# Patient Record
Sex: Female | Born: 1965
Health system: Southern US, Community
[De-identification: ages and names within clinical notes are randomized; demographics above are authoritative.]

## PROBLEM LIST (undated history)

## (undated) DIAGNOSIS — N950 Postmenopausal bleeding: Secondary | ICD-10-CM

## (undated) DIAGNOSIS — E039 Hypothyroidism, unspecified: Secondary | ICD-10-CM

## (undated) DIAGNOSIS — Z9889 Other specified postprocedural states: Secondary | ICD-10-CM

## (undated) DIAGNOSIS — Z8489 Family history of other specified conditions: Secondary | ICD-10-CM

---

## 1998-03-24 ENCOUNTER — Ambulatory Visit (HOSPITAL_COMMUNITY): Admission: RE | Admit: 1998-03-24 | Discharge: 1998-03-24 | Payer: Self-pay | Admitting: Obstetrics and Gynecology

## 1998-06-11 ENCOUNTER — Inpatient Hospital Stay (HOSPITAL_COMMUNITY): Admission: AD | Admit: 1998-06-11 | Discharge: 1998-06-13 | Payer: Self-pay | Admitting: Obstetrics and Gynecology

## 1998-07-16 ENCOUNTER — Encounter (HOSPITAL_COMMUNITY): Admission: RE | Admit: 1998-07-16 | Discharge: 1998-10-14 | Payer: Self-pay | Admitting: Obstetrics and Gynecology

## 1998-07-28 ENCOUNTER — Other Ambulatory Visit: Admission: RE | Admit: 1998-07-28 | Discharge: 1998-07-28 | Payer: Self-pay | Admitting: Obstetrics and Gynecology

## 2000-07-23 ENCOUNTER — Other Ambulatory Visit: Admission: RE | Admit: 2000-07-23 | Discharge: 2000-07-23 | Payer: Self-pay | Admitting: Obstetrics and Gynecology

## 2000-12-02 ENCOUNTER — Ambulatory Visit (HOSPITAL_COMMUNITY): Admission: RE | Admit: 2000-12-02 | Discharge: 2000-12-02 | Payer: Self-pay | Admitting: Obstetrics and Gynecology

## 2001-05-23 ENCOUNTER — Ambulatory Visit (HOSPITAL_COMMUNITY): Admission: RE | Admit: 2001-05-23 | Discharge: 2001-05-23 | Payer: Self-pay | Admitting: Obstetrics and Gynecology

## 2001-08-04 ENCOUNTER — Other Ambulatory Visit: Admission: RE | Admit: 2001-08-04 | Discharge: 2001-08-04 | Payer: Self-pay | Admitting: Obstetrics and Gynecology

## 2001-08-15 ENCOUNTER — Encounter: Payer: Self-pay | Admitting: Obstetrics and Gynecology

## 2001-08-15 ENCOUNTER — Ambulatory Visit (HOSPITAL_COMMUNITY): Admission: RE | Admit: 2001-08-15 | Discharge: 2001-08-15 | Payer: Self-pay | Admitting: Obstetrics and Gynecology

## 2002-03-09 ENCOUNTER — Ambulatory Visit (HOSPITAL_COMMUNITY): Admission: RE | Admit: 2002-03-09 | Discharge: 2002-03-09 | Payer: Self-pay | Admitting: Obstetrics and Gynecology

## 2002-05-28 ENCOUNTER — Inpatient Hospital Stay (HOSPITAL_COMMUNITY): Admission: AD | Admit: 2002-05-28 | Discharge: 2002-05-30 | Payer: Self-pay | Admitting: Obstetrics and Gynecology

## 2002-07-09 ENCOUNTER — Other Ambulatory Visit: Admission: RE | Admit: 2002-07-09 | Discharge: 2002-07-09 | Payer: Self-pay

## 2003-01-23 HISTORY — PX: LASIK: SHX215

## 2003-07-28 ENCOUNTER — Other Ambulatory Visit: Admission: RE | Admit: 2003-07-28 | Discharge: 2003-07-28 | Payer: Self-pay | Admitting: Obstetrics and Gynecology

## 2004-03-31 ENCOUNTER — Encounter: Admission: RE | Admit: 2004-03-31 | Discharge: 2004-03-31 | Payer: Self-pay | Admitting: Obstetrics and Gynecology

## 2004-09-12 ENCOUNTER — Other Ambulatory Visit: Admission: RE | Admit: 2004-09-12 | Discharge: 2004-09-12 | Payer: Self-pay | Admitting: Obstetrics and Gynecology

## 2006-08-15 ENCOUNTER — Encounter: Admission: RE | Admit: 2006-08-15 | Discharge: 2006-08-15 | Payer: Self-pay | Admitting: Obstetrics and Gynecology

## 2008-05-21 ENCOUNTER — Ambulatory Visit (HOSPITAL_COMMUNITY): Admission: RE | Admit: 2008-05-21 | Discharge: 2008-05-21 | Payer: Self-pay | Admitting: Obstetrics and Gynecology

## 2009-08-23 ENCOUNTER — Encounter: Admission: RE | Admit: 2009-08-23 | Discharge: 2009-08-23 | Payer: Self-pay | Admitting: Obstetrics and Gynecology

## 2010-08-09 ENCOUNTER — Other Ambulatory Visit: Payer: Self-pay | Admitting: Chiropractic Medicine

## 2010-08-09 DIAGNOSIS — M25511 Pain in right shoulder: Secondary | ICD-10-CM

## 2010-08-10 ENCOUNTER — Other Ambulatory Visit: Payer: Self-pay

## 2010-08-11 ENCOUNTER — Ambulatory Visit
Admission: RE | Admit: 2010-08-11 | Discharge: 2010-08-11 | Disposition: A | Discharge status: Home / Self Care | Payer: 59 | Source: Ambulatory Visit | Attending: Chiropractic Medicine | Admitting: Chiropractic Medicine

## 2010-08-11 DIAGNOSIS — M25511 Pain in right shoulder: Secondary | ICD-10-CM

## 2010-08-23 HISTORY — PX: SHOULDER ARTHROSCOPY WITH ROTATOR CUFF REPAIR AND OPEN BICEPS TENODESIS: SHX6677

## 2011-04-17 ENCOUNTER — Other Ambulatory Visit: Payer: Self-pay | Admitting: Chiropractic Medicine

## 2011-04-17 DIAGNOSIS — M25511 Pain in right shoulder: Secondary | ICD-10-CM

## 2011-04-19 ENCOUNTER — Ambulatory Visit
Admission: RE | Admit: 2011-04-19 | Discharge: 2011-04-19 | Disposition: A | Discharge status: Home / Self Care | Payer: 59 | Source: Ambulatory Visit | Attending: Chiropractic Medicine | Admitting: Chiropractic Medicine

## 2011-04-19 DIAGNOSIS — M25511 Pain in right shoulder: Secondary | ICD-10-CM

## 2011-04-23 HISTORY — PX: SHOULDER ARTHROSCOPY WITH ROTATOR CUFF REPAIR: SHX5685

## 2012-04-23 ENCOUNTER — Other Ambulatory Visit: Payer: Self-pay | Admitting: Obstetrics and Gynecology

## 2012-04-23 DIAGNOSIS — R928 Other abnormal and inconclusive findings on diagnostic imaging of breast: Secondary | ICD-10-CM

## 2012-05-02 ENCOUNTER — Ambulatory Visit
Admission: RE | Admit: 2012-05-02 | Discharge: 2012-05-02 | Disposition: A | Discharge status: Home / Self Care | Payer: 59 | Source: Ambulatory Visit | Attending: Obstetrics and Gynecology | Admitting: Obstetrics and Gynecology

## 2012-05-02 DIAGNOSIS — R928 Other abnormal and inconclusive findings on diagnostic imaging of breast: Secondary | ICD-10-CM

## 2013-01-08 ENCOUNTER — Other Ambulatory Visit: Payer: Self-pay | Admitting: Chiropractic Medicine

## 2013-01-08 DIAGNOSIS — M25512 Pain in left shoulder: Secondary | ICD-10-CM

## 2013-01-13 ENCOUNTER — Ambulatory Visit
Admission: RE | Admit: 2013-01-13 | Discharge: 2013-01-13 | Disposition: A | Discharge status: Home / Self Care | Payer: 59 | Source: Ambulatory Visit | Attending: Chiropractic Medicine | Admitting: Chiropractic Medicine

## 2013-01-13 DIAGNOSIS — M25512 Pain in left shoulder: Secondary | ICD-10-CM

## 2013-02-18 ENCOUNTER — Encounter: Payer: Self-pay | Admitting: Sports Medicine

## 2013-02-18 ENCOUNTER — Ambulatory Visit (INDEPENDENT_AMBULATORY_CARE_PROVIDER_SITE_OTHER): Payer: 59 | Admitting: Sports Medicine

## 2013-02-18 VITALS — BP 126/85 | Ht 63.5 in | Wt 127.0 lb

## 2013-02-18 DIAGNOSIS — M719 Bursopathy, unspecified: Secondary | ICD-10-CM

## 2013-02-18 DIAGNOSIS — IMO0002 Reserved for concepts with insufficient information to code with codable children: Secondary | ICD-10-CM

## 2013-02-18 DIAGNOSIS — M6790 Unspecified disorder of synovium and tendon, unspecified site: Secondary | ICD-10-CM

## 2013-02-18 DIAGNOSIS — M7550 Bursitis of unspecified shoulder: Secondary | ICD-10-CM

## 2013-02-18 DIAGNOSIS — M75100 Unspecified rotator cuff tear or rupture of unspecified shoulder, not specified as traumatic: Secondary | ICD-10-CM

## 2013-02-18 DIAGNOSIS — M67919 Unspecified disorder of synovium and tendon, unspecified shoulder: Secondary | ICD-10-CM

## 2013-02-18 NOTE — Progress Notes (Signed)
   Subjective:    Patient ID: Leslie Richard, female    DOB: 1965-04-10, 48 y.o.   MRN: [id number]  HPI Leslie Richard is a 48 year-old lady who presents today complaining of bilateral shoulder pain.  She has had a long history of pain.  In August 2012, she had RTC (supraspinatus) surgery followed by another RTC surgery in April 2013 after she had fallen down stairs injuring her right biceps and suprispinatus. Pain much improved after the surgery however this past October (2014) noted increased pain of her shoulders, especially worse in December noting that she had 6-7 dull constant aching of her shoulders and limited range of motion.  She is an avid Firefighter and has been following with Angelia Mould, Physical Therapist and also a chiropractor who has been doing active release therapy for bursitis in her left shoulder.  She has tendinopathy in her right shoulder.  See A&P for Dec MRI details.  She states she has been working larger muscle groups in her back and shoulders with her personal trainer and that the pain in her shoulders is getting better; however she is concerned about pain that is exacerbated by tennis which results in varying degrees of pain at posterolateral and medial shoulder as well as scapular areas and wants to ensure that she is on a successful recovery track before tennis season really picks up in six weeks.    Review of Systems As above.    Objective:   Physical Exam General: NAD; well-developed, well-nourished HEENT: [place]/AT; PER Cardio: RRR; no m/r/g Respiratory: CTAB; work of breathing unlabored Abd: soft, nondistended MSK: Shoulders - Obrien's negative bilaterally, Hawkin's negative bilaterally.  Patient has good abducting, adducting, flexion and extension strength.  Of note, left internal rotation > right.  Also, there is a some crepitus noted beneath left scapula on abduction of left arm. Spurling's negative. Neer's and empty can tests normal. Good range of motion all  planes. No scapular winging. Neuro: AAOx3; CN grossly intact Psych: normal mood and affect      Assessment & Plan:  Bilateral shoulder pain: -MRI of left shoulder December 2014 showed presence of bursitis, otherwise normal. -MRI of right shoulder December 2014 showed findings c/w previous rotator cuff repair, RTC intact with presence of mild tendinopathy in the supraspinatus and infraspinatus.  Also biceps tendinosis.  Mild edema noted in superior aspect of humeral head which may be 2/2 prior surgery or mild RTC tendinopathy. -Patient encouraged to continue scapular stabilization exercises and strength training.  She was cautioned about lifting weights overhead.   -Patient seems to be doing well with her current regimen.  She declines stronger NSAID at this time to use on a prn basis.  May consider if she experiences increased pain during her more active tennis periods. - Follow up PRN  Above written by Matthew Folks, MD visiting PGY III.

## 2013-03-10 ENCOUNTER — Encounter: Payer: Self-pay | Admitting: Sports Medicine

## 2013-03-11 ENCOUNTER — Encounter: Payer: Self-pay | Admitting: *Deleted

## 2013-03-11 MED ORDER — DICLOFENAC SODIUM 75 MG PO TBEC: 75.0000 mg | DELAYED_RELEASE_TABLET | Freq: Two times a day (BID) | ORAL | Status: DC | PRN

## 2013-04-06 ENCOUNTER — Ambulatory Visit: Payer: 59 | Admitting: Sports Medicine

## 2013-04-08 ENCOUNTER — Ambulatory Visit (INDEPENDENT_AMBULATORY_CARE_PROVIDER_SITE_OTHER): Payer: 59 | Admitting: Sports Medicine

## 2013-04-08 ENCOUNTER — Encounter: Payer: Self-pay | Admitting: Sports Medicine

## 2013-04-08 VITALS — BP 117/67 | HR 71 | Ht 63.5 in | Wt 127.0 lb

## 2013-04-08 DIAGNOSIS — M25519 Pain in unspecified shoulder: Secondary | ICD-10-CM

## 2013-04-08 NOTE — Progress Notes (Addendum)
   Subjective:    Patient ID: Leslie Richard, female    DOB: [birth year], 48 y.o.   MRN: [id number]  HPI  Leslie Richard is right hand dominant and has a history of left shoulder bursitis proven on MRI who presents with new onset of left shoulder pain. Approximately 3 weeks ago when she tripped while walking up the stairs and grabbed the hand rail trying to catch herself and has her body went forward her arm was stretched in abduction and external rotation. She had immediate pain which was improving with ice, heat, rest, Voltaren, chiropractor, and acupuncture treatment. Then on 3/16 while playing tennis she twisted her body to go for a ball and had a shock of pain in the same place of the shoulder. She was holding her racket in her right hand at the time. Pain was relieved with massage and ice.   Pain is dull, achy, and constant. Denies nighttime waking due to pain or radiating down the arm. She has been limiting the amount of upper body activity and cannot think of anything that makes the pain worse. Improved with Voltaren 75 MG BID.   She has a well-documented history of problems with the shoulder. Please see her previous office note for further details.   Review of Systems Negative apart for HPI     Objective:   Physical Exam  Shoulder Inspection: no erythema, swelling Palpation: Mildly tender to palpation along the teres minor with small trigger point. No radiating pain. No tenderness along the shoulder joint or rotator cuff insertion points. ROM: normal ROM bilaterally. Normal strength in rotator cuff and biceps and triceps Special tests: negative empty can, negative Hawkins, negative O'Briens.     Assessment & Plan:  Left shoulder pain likely secondary to teres minor muscle strain  --Reassurance. Today's exam does not suggest any sort of rotator cuff or labral injury. --Keep doing shoulder strengthening exercises --Continue to go to acupuncture and massage if this is helpful --Can  try lying on tennis ball at home to massage the muscle --Follow-up in 3 weeks if symptoms are not improved.   Seen with Jacqulyn Liner, MS4

## 2013-05-04 ENCOUNTER — Ambulatory Visit (INDEPENDENT_AMBULATORY_CARE_PROVIDER_SITE_OTHER): Payer: 59 | Admitting: Sports Medicine

## 2013-05-04 ENCOUNTER — Encounter: Payer: Self-pay | Admitting: Sports Medicine

## 2013-05-04 VITALS — BP 108/71 | Wt 126.0 lb

## 2013-05-04 DIAGNOSIS — M75100 Unspecified rotator cuff tear or rupture of unspecified shoulder, not specified as traumatic: Secondary | ICD-10-CM

## 2013-05-04 DIAGNOSIS — M7550 Bursitis of unspecified shoulder: Secondary | ICD-10-CM

## 2013-05-04 DIAGNOSIS — IMO0002 Reserved for concepts with insufficient information to code with codable children: Secondary | ICD-10-CM

## 2013-05-04 MED ORDER — METHYLPREDNISOLONE ACETATE 40 MG/ML IJ SUSP
40.0000 mg | Freq: Once | INTRAMUSCULAR | Status: AC
  Administered 2013-05-04: 40 mg via INTRA_ARTICULAR

## 2013-05-04 NOTE — Progress Notes (Signed)
   Subjective:    Patient ID: Leslie Richard, female    DOB: Dec 11, 1965, 48 y.o.   MRN: [id number]  HPI Patient comes in today for followup on left shoulder pain. Unfortunately, her shoulder is still bothering her. She does note good symptom relief with diclofenac but as soon as she stops taking this medicine her symptoms return. She is having pain along the posterior and lateral aspect of her shoulder. A recent MRI scan prior to her injury showed evidence of subacromial bursitis. No rotator cuff tear. No labral tear. Patient's symptoms are identical in nature to what she was experiencing with her bursitis. No numbness or tingling. She is having some difficulty sleeping on the shoulder at night.    Review of Systems     Objective:   Physical Exam Well-developed, well-nourished. No acute distress. Awake alert and oriented x3. Vital signs are reviewed.  Left shoulder: Full range of motion with a positive painful ARC. No tenderness over the a.c. joint or bicipital groove. Negative empty can but positive Hawkins. Rotator cuff strength is 5/5. Negative O'Briens. There is pain with passive abduction and external rotation which is localized to the lateral shoulder. Neurovascularly intact distally.       Assessment & Plan:  Persistent left shoulder pain with MRI evidence of subacromial bursitis  With her persistent symptoms I do not believe that she is dealing with a simple teres minor muscle strain. Since her symptoms are similar to what she experienced with her previous subacromial bursitis I recommend a diagnostic/therapeutic subacromial cortisone injection today. I will refill her diclofenac to take on an as-needed basis since it does seem to help with her pain. Once her pain has improved, she will resume a rotator cuff strengthening program (she is aware of these exercises). Followup with me in 4 weeks. If symptoms persist we may need to consider merits of repeat imaging to rule out either a  new rotator cuff tear or possibly a labral tear.  Consent obtained and verified. Time-out conducted. Noted no overlying erythema, induration, or other signs of local infection. Skin prepped in a sterile fashion. Topical analgesic spray: Ethyl chloride. Joint: left subacromial space Needle: 25g 1.5 inch Completed without difficulty. Meds: 1cc (40mg ) depomedrol, 3cc 1% xylocaine  Advised to call if fevers/chills, erythema, induration, drainage, or persistent bleeding.

## 2013-05-06 ENCOUNTER — Other Ambulatory Visit: Payer: Self-pay | Admitting: Sports Medicine

## 2013-05-27 ENCOUNTER — Ambulatory Visit (INDEPENDENT_AMBULATORY_CARE_PROVIDER_SITE_OTHER): Payer: 59 | Admitting: Sports Medicine

## 2013-05-27 ENCOUNTER — Encounter: Payer: Self-pay | Admitting: Sports Medicine

## 2013-05-27 VITALS — BP 120/71 | Ht 63.5 in | Wt 126.0 lb

## 2013-05-27 DIAGNOSIS — IMO0002 Reserved for concepts with insufficient information to code with codable children: Secondary | ICD-10-CM

## 2013-05-27 DIAGNOSIS — M75100 Unspecified rotator cuff tear or rupture of unspecified shoulder, not specified as traumatic: Secondary | ICD-10-CM

## 2013-05-27 DIAGNOSIS — M7550 Bursitis of unspecified shoulder: Secondary | ICD-10-CM

## 2013-05-27 MED ORDER — DICLOFENAC SODIUM 75 MG PO TBEC: 75.0000 mg | DELAYED_RELEASE_TABLET | Freq: Two times a day (BID) | ORAL | Status: DC

## 2013-05-27 NOTE — Progress Notes (Signed)
   Subjective:    Patient ID: Leslie Richard, female    DOB: 02/16/1965, 48 y.o.   MRN: [id number]  HPI Patient comes in today for followup on left shoulder pain. Overall, pain has improved over the past 4 weeks, especially over the past week. For the past 7 nights she has been able to sleep without pain. She is not sure whether or not the subacromial cortisone injection was of much help. She continues to take diclofenac but has weaned down to taking it only once daily and finds it quite helpful. She is able to do most activities of daily living as well as recreational activities without pain. She only gets occasional catching and pain in the shoulder which at times will radiate down the entire arm. She has been doing her Jobe home exercises and has also been doing some scapular protraction exercises but she feels like that protraction exercises actually caused her increasing discomfort.    Review of Systems     Objective:   Physical Exam Well-developed, fit-appearing. No acute distress. Awake alert and oriented x3.  Left shoulder: Patient demonstrates full painless shoulder range of motion. There is no tenderness over the a.c. joint nor over the bicipital groove. Her rotator cuff strength is 5/5 and nonreproducible of pain. Negative empty can, negative Hawkins. Negative O'Brien, negative clunk. Negative speed, negative Yergason. Neurovascularly intact distally.       Assessment & Plan:  Improving left shoulder pain with previous MRI evidence of subacromial bursitis  Given her overall improvement we will have her continue with her home exercise program. However, I want her to replace her scapular protraction exercises with scapular retraction exercises. I would also like for her to wean completely off of her diclofenac over the next 2-3 weeks. Since she is not getting significant disability with exercise or activities of daily living I am not overly concerned about a big rotator cuff or  labral tear at this point in time. However, I did explain to the patient that if her improvement plateaus or her symptoms worsen, then I want her to contact me and I would reconsider repeat diagnostic imaging in the form of an MRI arthrogram. Otherwise, followup when necessary.

## 2013-06-02 ENCOUNTER — Encounter: Payer: Self-pay | Admitting: *Deleted

## 2013-06-02 ENCOUNTER — Encounter: Payer: Self-pay | Admitting: Sports Medicine

## 2013-06-02 ENCOUNTER — Other Ambulatory Visit: Payer: Self-pay | Admitting: *Deleted

## 2013-06-02 DIAGNOSIS — M25512 Pain in left shoulder: Secondary | ICD-10-CM

## 2013-06-03 ENCOUNTER — Ambulatory Visit: Payer: 59 | Admitting: Sports Medicine

## 2013-06-12 ENCOUNTER — Ambulatory Visit
Admission: RE | Admit: 2013-06-12 | Discharge: 2013-06-12 | Disposition: A | Discharge status: Home / Self Care | Payer: 59 | Source: Ambulatory Visit | Attending: Sports Medicine | Admitting: Sports Medicine

## 2013-06-12 DIAGNOSIS — M25512 Pain in left shoulder: Secondary | ICD-10-CM

## 2013-06-12 MED ORDER — IOHEXOL 180 MG/ML  SOLN
15.0000 mL | Freq: Once | INTRAMUSCULAR | Status: AC | PRN
  Administered 2013-06-12: 15 mL via INTRA_ARTICULAR

## 2013-06-16 ENCOUNTER — Telehealth: Payer: Self-pay | Admitting: Sports Medicine

## 2013-06-16 NOTE — Telephone Encounter (Signed)
I spoke with the patient on the phone today regarding MRI arthrogram findings of her left shoulder. No evidence of rotator cuff tear. No evidence of labral tear. There is some minimal rotator cuff tendinopathy. I reassured her that she has no operative pathology. At this point I have recommended a return to physical therapy Nanda Quinton) to make sure that she understands a good comprehensive rotator cuff strengthening program and scapular stabilization program. Since there is no significant structural pathology her symptoms are likely originating from rotator cuff impingement and some degree of scapular dyskinesis. Physical therapy should help tremendously with this. I've also cautioned her about repetitive overhead motion. She has a scheduled followup appointment with me early next week which she may feel free to cancel if she wants.

## 2013-06-16 NOTE — Telephone Encounter (Signed)
I spoke with the patient on the phone today regarding MRI arthrogram findings of her left shoulder. No evidence of rotator cuff tear. No evidence of labral tear. There is some minimal rotator cuff tendinopathy. I reassured her that she has no operative pathology. At this point I have recommended a return to physical therapy (Jay Reily) to make sure that she understands a good comprehensive rotator cuff strengthening program and scapular stabilization program. Since there is no significant structural pathology her symptoms are likely originating from rotator cuff impingement and some degree of scapular dyskinesis. Physical therapy should help tremendously with this. I've also cautioned her about repetitive overhead motion. She has a scheduled followup appointment with me early next week which she may feel free to cancel if she wants. 

## 2013-06-22 ENCOUNTER — Ambulatory Visit: Payer: 59 | Admitting: Sports Medicine

## 2013-06-22 ENCOUNTER — Other Ambulatory Visit: Payer: Self-pay | Admitting: *Deleted

## 2013-06-22 DIAGNOSIS — M25512 Pain in left shoulder: Secondary | ICD-10-CM

## 2013-07-06 ENCOUNTER — Ambulatory Visit: Payer: 59 | Admitting: Sports Medicine

## 2013-07-21 ENCOUNTER — Ambulatory Visit (INDEPENDENT_AMBULATORY_CARE_PROVIDER_SITE_OTHER): Payer: 59 | Admitting: Sports Medicine

## 2013-07-21 ENCOUNTER — Encounter: Payer: Self-pay | Admitting: Sports Medicine

## 2013-07-21 VITALS — BP 117/64 | Ht 63.5 in | Wt 125.0 lb

## 2013-07-21 DIAGNOSIS — M7502 Adhesive capsulitis of left shoulder: Secondary | ICD-10-CM

## 2013-07-21 DIAGNOSIS — M7500 Adhesive capsulitis of unspecified shoulder: Secondary | ICD-10-CM

## 2013-07-21 NOTE — Progress Notes (Signed)
   Subjective:    Patient ID: Leslie Richard, female    DOB: [birth year], 48 y.o.   MRN: [id number]  HPI Patient comes in today for followup on left shoulder pain. MRI arthrogram done earlier this year showed only some mild rotator cuff tendinopathy. She worked with Nanda Quinton for a single visit and has been doing her home exercises diligently. She has also had some dry needling sessions at [facility] which is found to be very helpful. In fact, she is not experiencing anymore nighttime pain is only needing to take her anti-inflammatory about twice a week. Although she is not experiencing any pain currently, she has noticed some limited mobility recently in her left shoulder. No new trauma. She began to notice it during her evaluation with Carney Harder.   Review of Systems     Objective:   Physical Exam Fit-appearing. No acute distress.  Left shoulder: Active forward flexion is to 160. Active abduction is to about 150. Internal rotation 80. External rotation 70-80. No pain. Negative empty can, negative Hawkins. Rotator cuff strength 5/5. Neurovascular intact distally.  Right shoulder: Full painless range of motion. No signs of impingement. Rotator cuff strength is 5/5.       Assessment & Plan:  Left shoulder pain secondary to mild rotator cuff tendinopathy with early adhesive capsulitis  Patient will start range of motion exercises at home. I gave her a complete set 5 exercises. She will do these daily and in conjunction with her rotator cuff exercises and scapular stabilization exercises. She can continue dry needling and followup with me in 4 weeks. She is okay to continue with activity as tolerated.

## 2013-08-19 ENCOUNTER — Ambulatory Visit (INDEPENDENT_AMBULATORY_CARE_PROVIDER_SITE_OTHER): Payer: BC Managed Care – PPO | Admitting: Sports Medicine

## 2013-08-19 ENCOUNTER — Encounter: Payer: Self-pay | Admitting: Sports Medicine

## 2013-08-19 VITALS — BP 122/73 | HR 73 | Ht 63.0 in | Wt 126.0 lb

## 2013-08-19 DIAGNOSIS — M7500 Adhesive capsulitis of unspecified shoulder: Secondary | ICD-10-CM

## 2013-08-19 DIAGNOSIS — M7502 Adhesive capsulitis of left shoulder: Secondary | ICD-10-CM

## 2013-08-19 NOTE — Progress Notes (Signed)
   Subjective:    Patient ID: Leslie Richard, female    DOB: 11-Apr-1965, 48 y.o.   MRN: [id number]  HPI Patient comes in today for followup on adhesive capsulitis of her left shoulder. She has been working with Johnston Ebbs and also doing some dry needling. She feels like she is "no better but no worse". Still with intermittent pain with reaching away from her body but she remains very active especially with tennis. She still notes some limitation in her range of motion as well.    Review of Systems     Objective:   Physical Exam Well-developed, fit-appearing. No acute distress.  Left shoulder: On today's exam she demonstrates 180 of full forward flexion. 170 of abduction. Full internal rotation. External rotation is still at about 70 compared to 90 on the right. No pain with cuff stressing. Neurovascular intact distally.      Assessment & Plan:  Adhesive capsulitis left shoulder  I do think the patient is making some improvement. Her range of motion is definitely better on today's exam than it was 4 weeks ago. I explained to her that external rotation is usually the last plane of motion to improve. She is very diligent about her home exercises. I've encouraged her to continue with these. She can also continue to work with Johnston Ebbs and continue with dry needling. If she notices worsening stiffness in the shoulder or increasing pain, we discussed scheduling an ultrasound-guided glenohumeral cortisone injection. Otherwise, she can continue with activity as tolerated and will followup with me when necessary.

## 2013-10-05 ENCOUNTER — Ambulatory Visit (INDEPENDENT_AMBULATORY_CARE_PROVIDER_SITE_OTHER): Payer: BC Managed Care – PPO | Admitting: Sports Medicine

## 2013-10-05 ENCOUNTER — Encounter: Payer: Self-pay | Admitting: Sports Medicine

## 2013-10-05 VITALS — BP 108/67 | Ht 63.5 in | Wt 127.0 lb

## 2013-10-05 DIAGNOSIS — M7502 Adhesive capsulitis of left shoulder: Secondary | ICD-10-CM

## 2013-10-05 DIAGNOSIS — M25512 Pain in left shoulder: Secondary | ICD-10-CM

## 2013-10-05 DIAGNOSIS — M25519 Pain in unspecified shoulder: Secondary | ICD-10-CM

## 2013-10-05 DIAGNOSIS — M7500 Adhesive capsulitis of unspecified shoulder: Secondary | ICD-10-CM | POA: Diagnosis not present

## 2013-10-05 MED ORDER — METHYLPREDNISOLONE ACETATE 40 MG/ML IJ SUSP
40.0000 mg | Freq: Once | INTRAMUSCULAR | Status: AC
  Administered 2013-10-05: 40 mg via INTRA_ARTICULAR

## 2013-10-05 NOTE — Progress Notes (Signed)
Pt presents for follow-up of left shoulder adhesive capsulitis and joint injection. She reports that the pain has been waxing and waning depending on her activity level. She is able to play tennis without significant pain but had severe pain after forearm planks during a workout and has more pain when reaching up or behind her with that arm.   On exam, the shoulder was normal appearing without tenderness to palpation. She had significant limitation with external rotation and some slight stiffness with abduction. Otherwise her exam was normal with good strength and ROM throughout.  Real-time Ultrasound Guided Injection of: left glenohumeral joint Ultrasound guided injection is preferred based on a randomized control clinical trial that shows increased duration, increased effect, greater accuracy, decreased procedural pain, increased response rate, and decreased cost with ultrasound guided versus blind injection. Consent obtained. Time-out conducted. Noted no overlying erythema, induration, or other signs of local infection. Skin prepped in a sterile fashion. Local anesthesia: none With sterile technique and under real time ultrasound guidance:  Completed without difficulty Meds: 3cc 1% xylocaine, 1cc (40mg ) depomedrol Needle: 25g 1.5 inch Advised to call if fevers/chills, erythema, induration, drainage, or persistent bleeding. Images saved.

## 2013-10-05 NOTE — Assessment & Plan Note (Signed)
Long standing, tolerating tennis but pain with work-outs - ultrasound guided glenohumeral injection today - f/u in 1 month and will consider restarting PT at that point

## 2014-08-09 ENCOUNTER — Ambulatory Visit
Admission: RE | Admit: 2014-08-09 | Discharge: 2014-08-09 | Disposition: A | Discharge status: Home / Self Care | Payer: BLUE CROSS/BLUE SHIELD | Source: Ambulatory Visit | Attending: Sports Medicine | Admitting: Sports Medicine

## 2014-08-09 ENCOUNTER — Ambulatory Visit (INDEPENDENT_AMBULATORY_CARE_PROVIDER_SITE_OTHER): Payer: BLUE CROSS/BLUE SHIELD | Admitting: Sports Medicine

## 2014-08-09 ENCOUNTER — Encounter: Payer: Self-pay | Admitting: Sports Medicine

## 2014-08-09 VITALS — BP 136/63 | HR 51 | Ht 63.0 in | Wt 128.0 lb

## 2014-08-09 DIAGNOSIS — M25551 Pain in right hip: Secondary | ICD-10-CM | POA: Diagnosis not present

## 2014-08-09 NOTE — Progress Notes (Addendum)
Subjective:     Patient ID: Leslie Richard, female   DOB: 1965-08-24, 49 y.o.   MRN: [id number]  HPI  Leslie Richard is a 49 yo who presents today with bilateral posterior hip pain. Her right hip has been bothering her for the past 10 months and her left hip started bothering her a month ago after she fell on her left side. She localizes the pain to her SI joints. She rates her pain at a 3/10 in her daily activity. At its worse in May, it was a 7/10 after she finished working out or playing tennis. She has done acupuncture, hip stretches, and yoga without significant relief. On July 11th, her chiropractor applied kinesiology athletic tape across her lower back at the level of her SI joints and this has caused the greatest relief in her pain thus far.   She has had adhesive capsulitis of her left shoulder since 49 2015. She has tried dry needling, therapeutic home exercises, and joint injection for this. She still has limited mobility but her shoulder pain has improved. Her chiropractor says that her SI joint pain is due to compensation from her shoulder immobility.   Review of Systems Per HPI.    Objective:   Physical Exam Gen: pleasant woman, NAD BP 136/63 mmHg  Pulse 51  Ht 5\' 3"  (1.6 m)  Wt 128 lb (58.06 kg)  BMI 22.68 kg/m2  MSK:  Hips/Back: 4 inch wide kinesiology athletic tape across low back. No swelling, erythema, ecchymosis noted around tape edges. Tenderness to palpation over bilateral SI joints. No pain to palpation along lateral hips or groin. Full ROM. Good hip strength with abduction, adduction, flexion, extension. Negative fabers exam. Negative straight leg test. Pelvic rock shows more mobility in right>left SI joint. Slight positive trandelenberg on right. Lower extremities neurovascularly intact.  L shoulder: No swelling, erythema, deformities noted. Reduced passive ROM with external rotation (60 degrees on left compared to 90 degrees on right) and abduction (70-80  degrees on left compared to full passive abduction on right). Full internal rotation, forward flexion.    Assessment:     Patient continues to have adhesive capsulitis of left shoulder despite needling and home exercises, although it does not seem to bother her. H She is likely has bilateral SI joint dysfunction (left worse than right). We will get films to rule out osteoarthritis of the SI joint.    Plan:      - Bilateral AP/pelvis x rays to rule out OA of hip joints - Refer to Celso Sickle for biomechanical evaluation and treatment - Follow up if symptoms worsen - If adhesive capsulitis has not resolved by December 2016, we will consider operative manipulation. She is not interested at this time.      Patient seen and evaluated with the above medical student. I agree with the plan of care. I think she would benefit greatly from working with Barbaraann Barthel. She should continue with chiropractic treatment as well. I will check some x-rays of her hips just to rule out OA although clinically I'm not overly suspicious of this. We did briefly discuss the possibility of manipulation under anesthesia for her chronic adhesive capsulitis of her left shoulder. Her symptoms have been present now for about 15 months. Pain is minimal and function is not terribly affected. She would like to avoid surgery at this time. Follow-up with me when necessary.  Addendum: X-rays reviewed. Nothing acute. No significant degenerative changes.

## 2014-08-10 ENCOUNTER — Telehealth: Payer: Self-pay | Admitting: *Deleted

## 2014-08-10 NOTE — Telephone Encounter (Signed)
-----   Message from Thurman Coyer, DO sent at 08/09/2014  5:28 PM EDT ----- Regarding: xray results Please call this patient and tell her that the x-rays of her hips are normal. No arthritis. ----- Message -----    From: Rad Results In Interface    Sent: 08/09/2014   1:17 PM      To: Thurman Coyer, DO

## 2014-08-10 NOTE — Telephone Encounter (Signed)
Called pt and made aware of results

## 2015-01-03 ENCOUNTER — Encounter: Payer: Self-pay | Admitting: Sports Medicine

## 2015-01-04 ENCOUNTER — Other Ambulatory Visit: Payer: Self-pay | Admitting: *Deleted

## 2015-01-04 MED ORDER — DICLOFENAC SODIUM 75 MG PO TBEC: 75.0000 mg | DELAYED_RELEASE_TABLET | Freq: Two times a day (BID) | ORAL | Status: DC

## 2015-03-21 ENCOUNTER — Encounter: Payer: Self-pay | Admitting: Sports Medicine

## 2015-03-21 ENCOUNTER — Ambulatory Visit (INDEPENDENT_AMBULATORY_CARE_PROVIDER_SITE_OTHER): Payer: Self-pay | Admitting: Sports Medicine

## 2015-03-21 ENCOUNTER — Other Ambulatory Visit: Payer: Self-pay | Admitting: *Deleted

## 2015-03-21 VITALS — BP 111/60 | HR 85 | Ht 63.0 in | Wt 128.0 lb

## 2015-03-21 DIAGNOSIS — M7502 Adhesive capsulitis of left shoulder: Secondary | ICD-10-CM

## 2015-03-21 DIAGNOSIS — M5010 Cervical disc disorder with radiculopathy, unspecified cervical region: Secondary | ICD-10-CM

## 2015-03-21 MED ORDER — DICLOFENAC SODIUM 75 MG PO TBEC: 75.0000 mg | DELAYED_RELEASE_TABLET | Freq: Two times a day (BID) | ORAL | Status: DC

## 2015-03-21 MED ORDER — GABAPENTIN 300 MG PO CAPS: 300.0000 mg | ORAL_CAPSULE | Freq: Every day | ORAL | Status: DC

## 2015-03-21 NOTE — Progress Notes (Signed)
   Subjective:    Patient ID: Leslie Richard, female    DOB: 09/20/1965, 50 y.o.   MRN: [id number]  HPI chief complaint: Bilateral shoulder pain  50 year old right-hand-dominant female comes in today complaining of bilateral shoulder pain, left greater than right, that has been present since January. She does not recall any specific injury. She denies any change in her activity. She describes a burning and aching pain that is in both shoulders both at rest as well as with activity. In fact, she states that activity actually makes her symptoms improve. Her pain will occasionally radiate down the right arm into the right forearm. She has a history of left shoulder adhesive capsulitis but states that her current pain is different in nature than what she has experienced in the past with her shoulder. She denies any weakness. She has been taking some Voltaren for some unrelated hip pain which has been helpful but not curative. She became concerned when her shoulder pain worsened what she discontinued her Voltaren. She denies any neck pain. No numbness or tingling.  Interim medical history reviewed Medications reviewed Allergies reviewed    Review of Systems    as above Objective:   Physical Exam  Well-developed, fit appearing. No acute distress. Awake alert and oriented 3.  Right shoulder: Full active and passive range of motion. No tenderness to palpation. No atrophy. Negative empty can, negative Hawkins. Rotator cuff strength is 5/5. No labral signs. Left shoulder: Patient lacks about 15 of external rotation. This is with both active and passive range of motion testing. Otherwise, range of motion is full. Negative empty can, negative Hawkins. Rotator cuff strength is 5/5. No tenderness to palpation. No labral signs.  Neurological exam: Patient has 5/5 strength in both upper extremities. Biceps and brachial radialis reflexes on the right are 1/4 compared to 2/4 on the left. Triceps reflexes  are equal bilaterally. Good radial ulnar pulses.      Assessment & Plan:  Bilateral shoulder pain-question cervical radiculopathy History of left shoulder adhesive capsulitis  I'm not convinced that her pain is originating from the shoulder. Based on her history and physical exam, I think her symptoms may be originating from the cervical spine. I would like to try her on 300 mg of gabapentin daily at bedtime. She will also talk with either her chiropractor or Barbaraann Barthel about a trial of cervical traction. I did discuss the possibility of a 6 day Sterapred Dosepak if the gabapentin was ineffective. I also discussed the possibility of decreasing her dose of gabapentin if she finds drowsiness to be an issue. She will follow-up with me in 4 weeks for reevaluation but will call with questions or concerns in the interim.

## 2015-04-05 ENCOUNTER — Other Ambulatory Visit: Payer: Self-pay | Admitting: *Deleted

## 2015-04-05 DIAGNOSIS — M542 Cervicalgia: Secondary | ICD-10-CM

## 2015-04-18 ENCOUNTER — Encounter: Payer: Self-pay | Admitting: Sports Medicine

## 2015-04-18 ENCOUNTER — Ambulatory Visit (INDEPENDENT_AMBULATORY_CARE_PROVIDER_SITE_OTHER): Payer: Self-pay | Admitting: Sports Medicine

## 2015-04-18 VITALS — BP 100/78 | Ht 63.0 in | Wt 128.0 lb

## 2015-04-18 DIAGNOSIS — M5010 Cervical disc disorder with radiculopathy, unspecified cervical region: Secondary | ICD-10-CM

## 2015-04-19 NOTE — Progress Notes (Signed)
   Subjective:    Patient ID: Leslie Richard, female    DOB: 24-Oct-1965, 50 y.o.   MRN: [id number]  HPI   Patient comes in today for follow-up on bilateral shoulder pain. The burning pain she was getting in the proximal left shoulder has resolved. The neuropathic pain she was getting into the right arm has improved by about 50%. She is tolerating Neurontin at night. She has weaned down from taking her diclofenac twice daily to once daily. She is working with both a physical therapist and a Restaurant manager, fast food but she has yet to try any cervical traction. She has not noticed any weakness in either arm.    Review of Systems     Objective:   Physical Exam  Well-developed, well-nourished. No acute distress. Awake alert and oriented 3  Neurological exam: Patient continues to demonstrate 5/5 strength in both upper extremities. Biceps reflex is 1/4 compared to 2/4 on the left. Brachioradialis reflex has improved to 2/4 on the right and is equal to the left. Triceps reflexes remain equal bilaterally. No atrophy. Sensation is intact to light touch grossly. Good radial and ulnar pulses distally.      Assessment & Plan:   Improving cervical radiculopathy  Patient will continue on her gabapentin for 4 more weeks. She will continue to wean from her diclofenac as symptoms allow. Continue with physical therapy until instructed in a good home exercise program. I still think she should try a short trial of cervical traction. If symptoms persist or worsen, I would consider a short course of prednisone. Otherwise, patient will follow-up with me for ongoing or recalcitrant issues.

## 2015-06-09 ENCOUNTER — Other Ambulatory Visit: Payer: Self-pay | Admitting: *Deleted

## 2015-06-09 ENCOUNTER — Encounter: Payer: Self-pay | Admitting: Sports Medicine

## 2015-06-09 MED ORDER — PREDNISONE 10 MG (21) PO TBPK: 10.0000 mg | ORAL_TABLET | Freq: Every day | ORAL | Status: DC

## 2015-06-29 ENCOUNTER — Other Ambulatory Visit: Payer: Self-pay | Admitting: Obstetrics and Gynecology

## 2015-06-29 DIAGNOSIS — Z1231 Encounter for screening mammogram for malignant neoplasm of breast: Secondary | ICD-10-CM

## 2015-07-19 ENCOUNTER — Ambulatory Visit
Admission: RE | Admit: 2015-07-19 | Discharge: 2015-07-19 | Disposition: A | Discharge status: Home / Self Care | Payer: No Typology Code available for payment source | Source: Ambulatory Visit | Attending: Obstetrics and Gynecology | Admitting: Obstetrics and Gynecology

## 2015-07-19 DIAGNOSIS — Z1231 Encounter for screening mammogram for malignant neoplasm of breast: Secondary | ICD-10-CM

## 2015-11-28 ENCOUNTER — Ambulatory Visit
Admission: RE | Admit: 2015-11-28 | Discharge: 2015-11-28 | Disposition: A | Discharge status: Home / Self Care | Payer: Self-pay | Source: Ambulatory Visit | Attending: Sports Medicine | Admitting: Sports Medicine

## 2015-11-28 ENCOUNTER — Ambulatory Visit (INDEPENDENT_AMBULATORY_CARE_PROVIDER_SITE_OTHER): Payer: Self-pay | Admitting: Sports Medicine

## 2015-11-28 ENCOUNTER — Encounter: Payer: Self-pay | Admitting: Sports Medicine

## 2015-11-28 VITALS — BP 134/82 | HR 66 | Ht 63.0 in | Wt 130.0 lb

## 2015-11-28 DIAGNOSIS — S93402A Sprain of unspecified ligament of left ankle, initial encounter: Secondary | ICD-10-CM

## 2015-11-28 DIAGNOSIS — M25572 Pain in left ankle and joints of left foot: Secondary | ICD-10-CM

## 2015-11-28 NOTE — Progress Notes (Addendum)
Subjective:    Patient ID: Leslie Richard, female    DOB: 06-10-1965, 49 y.o.   MRN: [id number]  HPI chief complaint: Left ankle pain  Patient comes in today complaining of 8 weeks of lateral left ankle pain. She suffered a rather significant inversion injury to this ankle 8 weeks ago. She had pain and swelling primarily along the lateral ankle. Some ecchymosis along the medial ankle. She has been working with her local chiropractor in an effort to rehabilitate her ankle sprain. Overall, her symptoms have improved but not completely resolved. She has been able to return to activity such as tennis but she is still getting intermittent discomfort which can be quite severe at times. She localizes her pain to the lateral ankle just distal to the lateral malleolus. She still has some persistent swelling. She denies any pain in her foot. She denies any pain in the medial ankle. She denies any history of ankle sprains or significant ankle trauma in the past. Prior to this injury she did note a "stiffness" in her left ankle which has persisted status post injury. Her current rehabilitation includes range of motion exercises and proprioception but she has not started doing any strengthening yet.   interim medical history reviewed   medications reviewed Allergies reviewed      Review of Systems     as above  Objective:   Physical Exam  well-developed, fit appearing. No acute distress. Awake alert and oriented 3  Left ankle: Patient lacks a few degrees of dorsiflexion and plantar flexion compared to the right ankle. She also lacks a few degrees of active eversion. Slight tenderness to palpation along the course of the calcaneal fibular ligament as well as over the lateral talus. Mild soft tissue swelling diffusely at the lateral ankle. No effusion. No tenderness to palpation along the peroneal tendons. No pain with resisted eversion. No tenderness to palpation at the medial malleolus nor over the  navicular or at the base of the fifth metatarsal. Negative anterior drawer. Negative talar tilt. Neurovascularly intact distally. Walking without a limp.    MSK ultrasound of the left ankle is performed. No obvious effusion is seen. Peroneal tendons are well visualized with no obvious tear but there is fluid around the peroneal brevis tendon just distal to the lateral malleolus.      Assessment & Plan:    left ankle pain and stiffness status post ankle sprain   I think the patient is doing a very good job of self rehabilitation. She'll continue with range of motion exercises and proprioception. I have given her some strengthening exercises to add to what she is already doing. She is also wearing a compression sleeve which she may continue to do until asymptomatic. I would like to get an x-ray of the ankle just to rule out any obvious OCD or avulsion fracture that she may have suffered at the time of her injury. I will call her with those results when available. If unremarkable, then she will continue to increase activity as tolerated. We discussed the merits of formal physical therapy if symptoms continue to linger. Of note, she does have a history of adhesive capsulitis of her shoulder. Quick literature search looking at adhesive capsulitis of the ankle shows it to be quite rare and is usually associated with repetitive trauma which does not fit this patient's pain.   Addendum: Patient notified that x-rays show no fracture or degenerative changes. Only finding is persistent lateral ankle soft tissue swelling.

## 2016-01-24 DIAGNOSIS — L821 Other seborrheic keratosis: Secondary | ICD-10-CM | POA: Diagnosis not present

## 2016-01-24 DIAGNOSIS — D1801 Hemangioma of skin and subcutaneous tissue: Secondary | ICD-10-CM | POA: Diagnosis not present

## 2016-01-24 DIAGNOSIS — L700 Acne vulgaris: Secondary | ICD-10-CM | POA: Diagnosis not present

## 2016-02-07 DIAGNOSIS — D122 Benign neoplasm of ascending colon: Secondary | ICD-10-CM | POA: Diagnosis not present

## 2016-02-07 DIAGNOSIS — Z1211 Encounter for screening for malignant neoplasm of colon: Secondary | ICD-10-CM | POA: Diagnosis not present

## 2016-02-07 DIAGNOSIS — K635 Polyp of colon: Secondary | ICD-10-CM | POA: Diagnosis not present

## 2016-04-09 ENCOUNTER — Encounter: Payer: Self-pay | Admitting: Sports Medicine

## 2016-04-11 ENCOUNTER — Other Ambulatory Visit: Payer: Self-pay | Admitting: *Deleted

## 2016-04-11 DIAGNOSIS — M25572 Pain in left ankle and joints of left foot: Secondary | ICD-10-CM

## 2016-04-23 ENCOUNTER — Ambulatory Visit
Admission: RE | Admit: 2016-04-23 | Discharge: 2016-04-23 | Disposition: A | Discharge status: Home / Self Care | Payer: 59 | Source: Ambulatory Visit | Attending: Sports Medicine | Admitting: Sports Medicine

## 2016-04-23 DIAGNOSIS — M25572 Pain in left ankle and joints of left foot: Secondary | ICD-10-CM | POA: Diagnosis not present

## 2016-05-01 ENCOUNTER — Telehealth: Payer: Self-pay | Admitting: Sports Medicine

## 2016-05-01 NOTE — Telephone Encounter (Signed)
I spoke with the patient on the phone today after reviewing the MRI of her left ankle. Dominant finding is evidence of a chronic lateral ankle sprain with tears of both the anterior talofibular ligament and calcaneofibular ligament. She also has synovitis along the lateral ankle with findings consistent with anterior lateral ankle impingement. Other findings of note include tendinitis of both the anterior tibialis tendon and peroneal tendons. It is also mentioned that she has a partial tear of the extensor digitorum longus tendon dorsal to the level of the talonavicular joint. I recommended that the patient schedule follow-up appointment for me to reevaluate her foot and ankle. I will plan on doing an intra-articular injection at that visit. I also want to do some blood work to rule out systemic arthropathy. Her main complaint is ankle stiffness which was present prior to her ankle injury. She is also getting stiffness in her left ankle and has a history of adhesive capsulitis in her shoulder as well as a number of other soft tissue and joint injuries.

## 2016-05-14 ENCOUNTER — Other Ambulatory Visit: Payer: Self-pay | Admitting: Sports Medicine

## 2016-05-14 ENCOUNTER — Ambulatory Visit (INDEPENDENT_AMBULATORY_CARE_PROVIDER_SITE_OTHER): Payer: 59 | Admitting: Sports Medicine

## 2016-05-14 ENCOUNTER — Encounter: Payer: Self-pay | Admitting: Sports Medicine

## 2016-05-14 VITALS — BP 110/55 | Ht 63.0 in | Wt 130.0 lb

## 2016-05-14 DIAGNOSIS — M25673 Stiffness of unspecified ankle, not elsewhere classified: Secondary | ICD-10-CM | POA: Diagnosis not present

## 2016-05-14 DIAGNOSIS — M659 Synovitis and tenosynovitis, unspecified: Secondary | ICD-10-CM

## 2016-05-14 NOTE — Progress Notes (Signed)
   Subjective:    Patient ID: Leslie Richard, female    DOB: 1966-01-13, 51 y.o.   MRN: [id number]  HPI   Patient comes in at my request to discuss MRI findings of her left ankle. The MRI shows evidence of a previous significant ankle sprain as well as synovitis and partial tearing of the extensor digitorum longus tendon, plantar fasciitis, and tenosynovitis of the peroneal tendons. Her main complaint is the inability to completely dorsiflex her left ankle. She does admit that this limited mobility does not affect her ability to stay active. She has been able to continue playing tennis. She denies any swelling. What pain she does get in the ankle is minimal and localized along the peroneal tendons just inferior to the lateral malleolus. She denies any pain in the anterior ankle. She denies pain in the medial ankle or at the plantar fascia. She denies any feelings of instability. She does wear a compression sleeve when playing tennis and she has found it to be helpful. She had some dry needling done last week which was extremely helpful.    Review of Systems As above    Objective:   Physical Exam  Well-developed, well-nourished. No acute distress. Awake alert and oriented 3. Vital signs reviewed  Left ankle: She lacks about 5 of dorsiflexion when compared to the uninvolved right ankle. Full plantar flexion. She also lacks about 5 of inversion compared to the right ankle. Full eversion. No soft tissue swelling. No effusion. Minimal tenderness to palpation along the peroneal tendons. No pain with resisted foot dorsiflexion and eversion. No tenderness to palpation along the anterior lateral joint line. The ankle is stable to talar tilt testing and anterior drawer testing. Stable to deltoid ligament stressing as well. She has full strength of both her great toe and lesser toes. No tenderness to palpation over the extensor digitorum longus. No obvious defect in this tendon. Neurovascularly intact  distally. Walking without a limp.      Assessment & Plan:   Posttraumatic left ankle stiffness  I think the majority of findings on her MRI are incidental. She suffered a rather significant ankle sprain several months ago and as a result of that I think she has persistent ankle stiffness. I do not get the sense that she has a lot of synovitis so I do not think she would benefit from an injection. I've given her a couple of different heel drop exercises to do. She will do the Alfredson heel drop exercise daily to see if she can increase dorsi flexion of her ankle. She will also do a modified Alfredson heel drop exercise for the peroneal tendon but if this causes pain then she will stop. I think her limited range of motion is minimal and I'm hopeful that it will continue to improve given enough time. I certainly think she can continue with all activity without restriction. I would like to go ahead with some blood work to rule out systemic arthropathy since she has a history of joint issues, primarily in her shoulders and her ankles. I will get a sedimentation rate, C-reactive protein, ANA, rheumatoid factor, and anti-CCP. I will call her with those results when available. She may continue with her dry needling if she finds it helpful and she will follow-up with me in the office as needed.

## 2016-05-15 LAB — SEDIMENTATION RATE
Sed Rate: 2 mm/hr (ref 0–40)
Sed Rate: 2 mm/hr (ref 0–40)

## 2016-05-15 LAB — C-REACTIVE PROTEIN: CRP: 0.3 mg/L (ref 0.0–4.9)

## 2016-05-15 LAB — RHEUMATOID FACTOR

## 2016-05-15 LAB — ANA: Anti Nuclear Antibody(ANA): NEGATIVE

## 2016-05-22 ENCOUNTER — Telehealth: Payer: Self-pay | Admitting: Sports Medicine

## 2016-05-22 NOTE — Telephone Encounter (Signed)
  Patient will be notified today of her laboratory results. They are all normal.

## 2016-05-28 DIAGNOSIS — Z1231 Encounter for screening mammogram for malignant neoplasm of breast: Secondary | ICD-10-CM | POA: Diagnosis not present

## 2016-05-28 DIAGNOSIS — Z6823 Body mass index (BMI) 23.0-23.9, adult: Secondary | ICD-10-CM | POA: Diagnosis not present

## 2016-05-28 DIAGNOSIS — Z01419 Encounter for gynecological examination (general) (routine) without abnormal findings: Secondary | ICD-10-CM | POA: Diagnosis not present

## 2016-09-19 DIAGNOSIS — H1045 Other chronic allergic conjunctivitis: Secondary | ICD-10-CM | POA: Diagnosis not present

## 2016-10-03 DIAGNOSIS — E784 Other hyperlipidemia: Secondary | ICD-10-CM | POA: Diagnosis not present

## 2016-10-03 DIAGNOSIS — R8299 Other abnormal findings in urine: Secondary | ICD-10-CM | POA: Diagnosis not present

## 2016-10-11 DIAGNOSIS — Z23 Encounter for immunization: Secondary | ICD-10-CM | POA: Diagnosis not present

## 2016-10-11 DIAGNOSIS — Z8249 Family history of ischemic heart disease and other diseases of the circulatory system: Secondary | ICD-10-CM | POA: Diagnosis not present

## 2016-10-11 DIAGNOSIS — Z0000 Encounter for general adult medical examination without abnormal findings: Secondary | ICD-10-CM | POA: Diagnosis not present

## 2016-10-11 DIAGNOSIS — Z1389 Encounter for screening for other disorder: Secondary | ICD-10-CM | POA: Diagnosis not present

## 2016-10-11 DIAGNOSIS — E784 Other hyperlipidemia: Secondary | ICD-10-CM | POA: Diagnosis not present

## 2016-12-10 DIAGNOSIS — M9906 Segmental and somatic dysfunction of lower extremity: Secondary | ICD-10-CM | POA: Diagnosis not present

## 2016-12-10 DIAGNOSIS — M722 Plantar fascial fibromatosis: Secondary | ICD-10-CM | POA: Diagnosis not present

## 2016-12-10 DIAGNOSIS — S93311A Subluxation of tarsal joint of right foot, initial encounter: Secondary | ICD-10-CM | POA: Diagnosis not present

## 2016-12-19 DIAGNOSIS — S93311A Subluxation of tarsal joint of right foot, initial encounter: Secondary | ICD-10-CM | POA: Diagnosis not present

## 2016-12-19 DIAGNOSIS — M722 Plantar fascial fibromatosis: Secondary | ICD-10-CM | POA: Diagnosis not present

## 2016-12-19 DIAGNOSIS — M9906 Segmental and somatic dysfunction of lower extremity: Secondary | ICD-10-CM | POA: Diagnosis not present

## 2016-12-26 DIAGNOSIS — S93311A Subluxation of tarsal joint of right foot, initial encounter: Secondary | ICD-10-CM | POA: Diagnosis not present

## 2016-12-26 DIAGNOSIS — M9906 Segmental and somatic dysfunction of lower extremity: Secondary | ICD-10-CM | POA: Diagnosis not present

## 2016-12-26 DIAGNOSIS — M722 Plantar fascial fibromatosis: Secondary | ICD-10-CM | POA: Diagnosis not present

## 2017-01-09 DIAGNOSIS — S93311A Subluxation of tarsal joint of right foot, initial encounter: Secondary | ICD-10-CM | POA: Diagnosis not present

## 2017-01-09 DIAGNOSIS — M722 Plantar fascial fibromatosis: Secondary | ICD-10-CM | POA: Diagnosis not present

## 2017-01-09 DIAGNOSIS — M9906 Segmental and somatic dysfunction of lower extremity: Secondary | ICD-10-CM | POA: Diagnosis not present

## 2017-01-16 DIAGNOSIS — M9906 Segmental and somatic dysfunction of lower extremity: Secondary | ICD-10-CM | POA: Diagnosis not present

## 2017-01-16 DIAGNOSIS — M722 Plantar fascial fibromatosis: Secondary | ICD-10-CM | POA: Diagnosis not present

## 2017-01-16 DIAGNOSIS — S93311A Subluxation of tarsal joint of right foot, initial encounter: Secondary | ICD-10-CM | POA: Diagnosis not present

## 2017-01-24 DIAGNOSIS — S93311A Subluxation of tarsal joint of right foot, initial encounter: Secondary | ICD-10-CM | POA: Diagnosis not present

## 2017-01-24 DIAGNOSIS — M722 Plantar fascial fibromatosis: Secondary | ICD-10-CM | POA: Diagnosis not present

## 2017-01-24 DIAGNOSIS — M9906 Segmental and somatic dysfunction of lower extremity: Secondary | ICD-10-CM | POA: Diagnosis not present

## 2017-02-07 DIAGNOSIS — M7918 Myalgia, other site: Secondary | ICD-10-CM | POA: Diagnosis not present

## 2017-02-07 DIAGNOSIS — M9907 Segmental and somatic dysfunction of upper extremity: Secondary | ICD-10-CM | POA: Diagnosis not present

## 2017-02-07 DIAGNOSIS — M25531 Pain in right wrist: Secondary | ICD-10-CM | POA: Diagnosis not present

## 2017-03-11 ENCOUNTER — Ambulatory Visit (INDEPENDENT_AMBULATORY_CARE_PROVIDER_SITE_OTHER): Payer: 59 | Admitting: Sports Medicine

## 2017-03-11 ENCOUNTER — Encounter: Payer: Self-pay | Admitting: Sports Medicine

## 2017-03-11 VITALS — BP 102/60 | Ht 63.5 in | Wt 120.0 lb

## 2017-03-11 DIAGNOSIS — M25539 Pain in unspecified wrist: Secondary | ICD-10-CM

## 2017-03-11 MED ORDER — DICLOFENAC SODIUM 75 MG PO TBEC: 75.0000 mg | DELAYED_RELEASE_TABLET | Freq: Two times a day (BID) | ORAL | 1 refills | Status: DC | PRN

## 2017-03-11 NOTE — Progress Notes (Signed)
   Subjective:    Patient ID: Leslie Richard, female    DOB: Feb 28, 1965, 52 y.o.   MRN: [id number]  HPI Ms. Zupko is a 52 year old female who presents for 3 weeks of right wrist pain. She states that she initially started having some right forearm pain while playing tennis 2 months ago which improved with dry needling and rest. However, she had an episode 3 weeks ago where she slept with her right wrist in a weird position and woke up with significant ulnar sided wrist pain. She has had several dry needling appointments for the wrist with some improvement. She has also been wearing a wrist brace while playing tennis and doing yoga. She also took 8 days of diclofenac with some improvement. She now only has pain in the wrist when she hits a one handed backhand volley or stretches her wrist in the wrong way. She denies any elbow pain. Denies any left wrist pain. Denies numbness or paresthesias.    Review of Systems Negative, except as stated above    Objective:   Physical Exam General: Well appearing, no acute distress. Awake alert and oriented 3. Vital signs reviewed Left wrist: No erythema or edema. ROM and strength intact. No tenderness to palpation of forearm. No tenderness of flexor or extensor masses. No bony tenderness. Mild tenderness to palpation of ulnar aspect of wrist, worse with radial deviation of wrist.   Bedside US performed, limited left wrist images obtained: Trace evidence of fluid around ECU tendon. Unable to clearly visualize TFCC.       Assessment & Plan:  52 year old female with 3 weeks of right wrist pain. Exam and [place] suspicious for ECU tendonopathy. Patient will be given a refill of diclofenac to take as needed. She may continue to play tennis with wrist brace. She was also given eccentric ECU exercises. Patient will follow up in clinic as needed.

## 2017-03-25 DIAGNOSIS — Z6820 Body mass index (BMI) 20.0-20.9, adult: Secondary | ICD-10-CM | POA: Diagnosis not present

## 2017-03-25 DIAGNOSIS — H6123 Impacted cerumen, bilateral: Secondary | ICD-10-CM | POA: Diagnosis not present

## 2017-06-18 DIAGNOSIS — Z1231 Encounter for screening mammogram for malignant neoplasm of breast: Secondary | ICD-10-CM | POA: Diagnosis not present

## 2017-06-18 DIAGNOSIS — Z01419 Encounter for gynecological examination (general) (routine) without abnormal findings: Secondary | ICD-10-CM | POA: Diagnosis not present

## 2017-06-18 DIAGNOSIS — Z6820 Body mass index (BMI) 20.0-20.9, adult: Secondary | ICD-10-CM | POA: Diagnosis not present

## 2017-09-04 DIAGNOSIS — H1045 Other chronic allergic conjunctivitis: Secondary | ICD-10-CM | POA: Diagnosis not present

## 2017-09-17 DIAGNOSIS — D225 Melanocytic nevi of trunk: Secondary | ICD-10-CM | POA: Diagnosis not present

## 2017-09-17 DIAGNOSIS — L821 Other seborrheic keratosis: Secondary | ICD-10-CM | POA: Diagnosis not present

## 2017-09-17 DIAGNOSIS — L814 Other melanin hyperpigmentation: Secondary | ICD-10-CM | POA: Diagnosis not present

## 2017-10-15 DIAGNOSIS — E7849 Other hyperlipidemia: Secondary | ICD-10-CM | POA: Diagnosis not present

## 2017-10-15 DIAGNOSIS — Z0000 Encounter for general adult medical examination without abnormal findings: Secondary | ICD-10-CM | POA: Diagnosis not present

## 2017-10-15 DIAGNOSIS — R82998 Other abnormal findings in urine: Secondary | ICD-10-CM | POA: Diagnosis not present

## 2017-10-22 DIAGNOSIS — Z0000 Encounter for general adult medical examination without abnormal findings: Secondary | ICD-10-CM | POA: Diagnosis not present

## 2017-10-22 DIAGNOSIS — Z1389 Encounter for screening for other disorder: Secondary | ICD-10-CM | POA: Diagnosis not present

## 2017-10-22 DIAGNOSIS — E7849 Other hyperlipidemia: Secondary | ICD-10-CM | POA: Diagnosis not present

## 2018-02-11 DIAGNOSIS — M7711 Lateral epicondylitis, right elbow: Secondary | ICD-10-CM | POA: Diagnosis not present

## 2018-02-11 DIAGNOSIS — M7541 Impingement syndrome of right shoulder: Secondary | ICD-10-CM | POA: Diagnosis not present

## 2018-02-11 DIAGNOSIS — M7542 Impingement syndrome of left shoulder: Secondary | ICD-10-CM | POA: Diagnosis not present

## 2018-02-18 DIAGNOSIS — M7711 Lateral epicondylitis, right elbow: Secondary | ICD-10-CM | POA: Diagnosis not present

## 2018-02-18 DIAGNOSIS — M7542 Impingement syndrome of left shoulder: Secondary | ICD-10-CM | POA: Diagnosis not present

## 2018-02-18 DIAGNOSIS — M7541 Impingement syndrome of right shoulder: Secondary | ICD-10-CM | POA: Diagnosis not present

## 2018-03-05 DIAGNOSIS — M79622 Pain in left upper arm: Secondary | ICD-10-CM | POA: Diagnosis not present

## 2018-03-05 DIAGNOSIS — M79621 Pain in right upper arm: Secondary | ICD-10-CM | POA: Diagnosis not present

## 2018-03-05 DIAGNOSIS — M25529 Pain in unspecified elbow: Secondary | ICD-10-CM | POA: Diagnosis not present

## 2018-03-13 DIAGNOSIS — M79622 Pain in left upper arm: Secondary | ICD-10-CM | POA: Diagnosis not present

## 2018-03-13 DIAGNOSIS — M25529 Pain in unspecified elbow: Secondary | ICD-10-CM | POA: Diagnosis not present

## 2018-03-13 DIAGNOSIS — M79621 Pain in right upper arm: Secondary | ICD-10-CM | POA: Diagnosis not present

## 2018-03-20 DIAGNOSIS — M25529 Pain in unspecified elbow: Secondary | ICD-10-CM | POA: Diagnosis not present

## 2018-03-20 DIAGNOSIS — M79622 Pain in left upper arm: Secondary | ICD-10-CM | POA: Diagnosis not present

## 2018-03-20 DIAGNOSIS — M79621 Pain in right upper arm: Secondary | ICD-10-CM | POA: Diagnosis not present

## 2020-01-23 HISTORY — PX: COLONOSCOPY: SHX174

## 2020-10-12 ENCOUNTER — Other Ambulatory Visit: Payer: Self-pay | Admitting: General Practice

## 2020-10-12 DIAGNOSIS — Z1231 Encounter for screening mammogram for malignant neoplasm of breast: Secondary | ICD-10-CM

## 2020-10-14 DIAGNOSIS — Z1231 Encounter for screening mammogram for malignant neoplasm of breast: Secondary | ICD-10-CM

## 2020-11-01 ENCOUNTER — Ambulatory Visit
Admission: RE | Admit: 2020-11-01 | Discharge: 2020-11-01 | Disposition: A | Discharge status: Home / Self Care | Payer: 59 | Source: Ambulatory Visit | Attending: Internal Medicine | Admitting: Internal Medicine

## 2020-11-01 ENCOUNTER — Other Ambulatory Visit: Payer: Self-pay

## 2020-11-01 DIAGNOSIS — Z1231 Encounter for screening mammogram for malignant neoplasm of breast: Secondary | ICD-10-CM

## 2020-11-07 ENCOUNTER — Other Ambulatory Visit: Payer: Self-pay | Admitting: General Practice

## 2020-11-07 ENCOUNTER — Other Ambulatory Visit: Payer: Self-pay | Admitting: *Deleted

## 2020-11-07 DIAGNOSIS — R928 Other abnormal and inconclusive findings on diagnostic imaging of breast: Secondary | ICD-10-CM

## 2020-11-24 ENCOUNTER — Other Ambulatory Visit: Payer: Self-pay | Admitting: Obstetrics and Gynecology

## 2020-11-24 DIAGNOSIS — R928 Other abnormal and inconclusive findings on diagnostic imaging of breast: Secondary | ICD-10-CM

## 2020-11-25 ENCOUNTER — Other Ambulatory Visit: Payer: Self-pay | Admitting: General Practice

## 2020-11-28 ENCOUNTER — Ambulatory Visit
Admission: RE | Admit: 2020-11-28 | Discharge: 2020-11-28 | Disposition: A | Discharge status: Home / Self Care | Payer: 59 | Source: Ambulatory Visit | Attending: *Deleted | Admitting: *Deleted

## 2020-11-28 ENCOUNTER — Other Ambulatory Visit: Payer: Self-pay

## 2020-11-28 ENCOUNTER — Ambulatory Visit: Payer: 59

## 2020-11-28 DIAGNOSIS — R928 Other abnormal and inconclusive findings on diagnostic imaging of breast: Secondary | ICD-10-CM

## 2021-11-07 ENCOUNTER — Other Ambulatory Visit: Payer: Self-pay | Admitting: Obstetrics and Gynecology

## 2021-11-07 DIAGNOSIS — Z780 Asymptomatic menopausal state: Secondary | ICD-10-CM

## 2021-11-10 ENCOUNTER — Other Ambulatory Visit: Payer: Self-pay | Admitting: Obstetrics and Gynecology

## 2021-11-10 DIAGNOSIS — Z1231 Encounter for screening mammogram for malignant neoplasm of breast: Secondary | ICD-10-CM

## 2021-12-12 ENCOUNTER — Other Ambulatory Visit: Payer: 59

## 2022-01-01 ENCOUNTER — Ambulatory Visit: Payer: 59

## 2022-01-11 ENCOUNTER — Other Ambulatory Visit (HOSPITAL_COMMUNITY): Payer: Self-pay | Admitting: Obstetrics and Gynecology

## 2022-01-11 DIAGNOSIS — Z8249 Family history of ischemic heart disease and other diseases of the circulatory system: Secondary | ICD-10-CM

## 2022-01-17 ENCOUNTER — Ambulatory Visit
Admission: RE | Admit: 2022-01-17 | Discharge: 2022-01-17 | Disposition: A | Discharge status: Home / Self Care | Payer: 59 | Source: Ambulatory Visit | Attending: Obstetrics and Gynecology | Admitting: Obstetrics and Gynecology

## 2022-01-17 DIAGNOSIS — Z1231 Encounter for screening mammogram for malignant neoplasm of breast: Secondary | ICD-10-CM

## 2022-01-24 ENCOUNTER — Ambulatory Visit (HOSPITAL_COMMUNITY)
Admission: RE | Admit: 2022-01-24 | Discharge: 2022-01-24 | Disposition: A | Discharge status: Home / Self Care | Payer: 59 | Source: Ambulatory Visit | Attending: Obstetrics and Gynecology | Admitting: Obstetrics and Gynecology

## 2022-01-24 DIAGNOSIS — Z8249 Family history of ischemic heart disease and other diseases of the circulatory system: Secondary | ICD-10-CM | POA: Insufficient documentation

## 2022-01-25 ENCOUNTER — Ambulatory Visit
Admission: RE | Admit: 2022-01-25 | Discharge: 2022-01-25 | Disposition: A | Discharge status: Home / Self Care | Payer: 59 | Source: Ambulatory Visit | Attending: Obstetrics and Gynecology | Admitting: Obstetrics and Gynecology

## 2022-01-25 DIAGNOSIS — Z780 Asymptomatic menopausal state: Secondary | ICD-10-CM

## 2022-04-29 ENCOUNTER — Other Ambulatory Visit: Payer: Self-pay | Admitting: Obstetrics and Gynecology

## 2022-04-30 ENCOUNTER — Encounter (HOSPITAL_BASED_OUTPATIENT_CLINIC_OR_DEPARTMENT_OTHER): Payer: Self-pay | Admitting: Obstetrics and Gynecology

## 2022-04-30 NOTE — Progress Notes (Signed)
Spoke w/ via phone for pre-op interview--- pt Lab needs dos----   cbc            Lab results------ no COVID test -----patient states asymptomatic no test needed Arrive at ------- 1215 on 05-04-2022 NPO after MN NO Solid Food.  Clear liquids from MN until--- 1115 Med rec completed Medications to take morning of surgery ----- np thyroid Diabetic medication ----- n/a Patient instructed no nail polish to be worn day of surgery Patient instructed to bring photo id and insurance card day of surgery Patient aware to have Driver (ride ) / caregiver for 24 hours after surgery --- husband, bradley Patient Special Instructions ----- n/a Pre-Op special Istructions ----- n/a Patient verbalized understanding of instructions that were given at this phone interview. Patient denies shortness of breath, chest pain, fever, cough at this phone interview.

## 2022-05-04 ENCOUNTER — Other Ambulatory Visit: Payer: Self-pay

## 2022-05-04 ENCOUNTER — Ambulatory Visit (HOSPITAL_BASED_OUTPATIENT_CLINIC_OR_DEPARTMENT_OTHER): Payer: 59 | Admitting: Anesthesiology

## 2022-05-04 ENCOUNTER — Ambulatory Visit (HOSPITAL_BASED_OUTPATIENT_CLINIC_OR_DEPARTMENT_OTHER)
Admission: RE | Admit: 2022-05-04 | Discharge: 2022-05-04 | Disposition: A | Discharge status: Home / Self Care | Payer: 59 | Attending: Obstetrics and Gynecology | Admitting: Obstetrics and Gynecology

## 2022-05-04 ENCOUNTER — Encounter (HOSPITAL_BASED_OUTPATIENT_CLINIC_OR_DEPARTMENT_OTHER)
Admission: RE | Disposition: A | Discharge status: Home / Self Care | Payer: Self-pay | Source: Home / Self Care | Attending: Obstetrics and Gynecology

## 2022-05-04 ENCOUNTER — Encounter (HOSPITAL_BASED_OUTPATIENT_CLINIC_OR_DEPARTMENT_OTHER): Payer: Self-pay | Admitting: Obstetrics and Gynecology

## 2022-05-04 DIAGNOSIS — Z01818 Encounter for other preprocedural examination: Secondary | ICD-10-CM

## 2022-05-04 DIAGNOSIS — E039 Hypothyroidism, unspecified: Secondary | ICD-10-CM | POA: Diagnosis not present

## 2022-05-04 DIAGNOSIS — N840 Polyp of corpus uteri: Secondary | ICD-10-CM | POA: Insufficient documentation

## 2022-05-04 DIAGNOSIS — N950 Postmenopausal bleeding: Secondary | ICD-10-CM | POA: Insufficient documentation

## 2022-05-04 HISTORY — PX: DILATATION & CURETTAGE/HYSTEROSCOPY WITH MYOSURE: SHX6511

## 2022-05-04 HISTORY — DX: Hypothyroidism, unspecified: E03.9

## 2022-05-04 HISTORY — DX: Postmenopausal bleeding: N95.0

## 2022-05-04 HISTORY — DX: Family history of other specified conditions: Z84.89

## 2022-05-04 HISTORY — DX: Other specified postprocedural states: Z98.890

## 2022-05-04 SURGERY — DILATATION & CURETTAGE/HYSTEROSCOPY WITH MYOSURE
Anesthesia: General

## 2022-05-04 MED ORDER — ONDANSETRON HCL 4 MG/2ML IJ SOLN: 4.0000 mg | Freq: Once | INTRAMUSCULAR | Status: DC | PRN

## 2022-05-04 MED ORDER — PROPOFOL 10 MG/ML IV BOLUS
INTRAVENOUS | Status: DC | PRN
  Administered 2022-05-04: 120 mg via INTRAVENOUS

## 2022-05-04 MED ORDER — MIDAZOLAM HCL 2 MG/2ML IJ SOLN
INTRAMUSCULAR | Status: AC
  Filled 2022-05-04: qty 2

## 2022-05-04 MED ORDER — FENTANYL CITRATE (PF) 100 MCG/2ML IJ SOLN: 25.0000 ug | INTRAMUSCULAR | Status: DC | PRN

## 2022-05-04 MED ORDER — SODIUM CHLORIDE 0.9 % IR SOLN
Status: DC | PRN
  Administered 2022-05-04: 3000 mL

## 2022-05-04 MED ORDER — DEXAMETHASONE SODIUM PHOSPHATE 10 MG/ML IJ SOLN
INTRAMUSCULAR | Status: AC
  Filled 2022-05-04: qty 1

## 2022-05-04 MED ORDER — PROPOFOL 10 MG/ML IV BOLUS
INTRAVENOUS | Status: AC
  Filled 2022-05-04: qty 20

## 2022-05-04 MED ORDER — SCOPOLAMINE 1 MG/3DAYS TD PT72
1.0000 | MEDICATED_PATCH | TRANSDERMAL | Status: DC
  Administered 2022-05-04: 1.5 mg via TRANSDERMAL

## 2022-05-04 MED ORDER — FENTANYL CITRATE (PF) 100 MCG/2ML IJ SOLN
INTRAMUSCULAR | Status: DC | PRN
  Administered 2022-05-04: 25 ug via INTRAVENOUS
  Administered 2022-05-04: 50 ug via INTRAVENOUS

## 2022-05-04 MED ORDER — LIDOCAINE 2% (20 MG/ML) 5 ML SYRINGE
INTRAMUSCULAR | Status: DC | PRN
  Administered 2022-05-04: 100 mg via INTRAVENOUS

## 2022-05-04 MED ORDER — MIDAZOLAM HCL 5 MG/5ML IJ SOLN
INTRAMUSCULAR | Status: DC | PRN
  Administered 2022-05-04: 2 mg via INTRAVENOUS

## 2022-05-04 MED ORDER — POVIDONE-IODINE 10 % EX SWAB: 2.0000 | Freq: Once | CUTANEOUS | Status: DC

## 2022-05-04 MED ORDER — FENTANYL CITRATE (PF) 100 MCG/2ML IJ SOLN
INTRAMUSCULAR | Status: AC
  Filled 2022-05-04: qty 2

## 2022-05-04 MED ORDER — ONDANSETRON HCL 4 MG/2ML IJ SOLN
INTRAMUSCULAR | Status: AC
  Filled 2022-05-04: qty 2

## 2022-05-04 MED ORDER — SCOPOLAMINE 1 MG/3DAYS TD PT72
MEDICATED_PATCH | TRANSDERMAL | Status: AC
  Filled 2022-05-04: qty 1

## 2022-05-04 MED ORDER — ACETAMINOPHEN 500 MG PO TABS
ORAL_TABLET | ORAL | Status: AC
  Filled 2022-05-04: qty 2

## 2022-05-04 MED ORDER — DEXAMETHASONE SODIUM PHOSPHATE 4 MG/ML IJ SOLN
INTRAMUSCULAR | Status: DC | PRN
  Administered 2022-05-04: 5 mg via INTRAVENOUS

## 2022-05-04 MED ORDER — ONDANSETRON HCL 4 MG/2ML IJ SOLN
INTRAMUSCULAR | Status: DC | PRN
  Administered 2022-05-04: 4 mg via INTRAVENOUS

## 2022-05-04 MED ORDER — LIDOCAINE HCL (PF) 2 % IJ SOLN
INTRAMUSCULAR | Status: AC
  Filled 2022-05-04: qty 5

## 2022-05-04 MED ORDER — ACETAMINOPHEN 500 MG PO TABS
1000.0000 mg | ORAL_TABLET | Freq: Once | ORAL | Status: AC
  Administered 2022-05-04: 1000 mg via ORAL

## 2022-05-04 MED ORDER — LACTATED RINGERS IV SOLN: INTRAVENOUS | Status: DC

## 2022-05-04 SURGICAL SUPPLY — 22 items
CATH ROBINSON RED A/P 16FR (CATHETERS) IMPLANT
DEVICE MYOSURE LITE (MISCELLANEOUS) IMPLANT
DEVICE MYOSURE REACH (MISCELLANEOUS) IMPLANT
DILATOR CANAL MILEX (MISCELLANEOUS) IMPLANT
DRSG TELFA 3X8 NADH STRL (GAUZE/BANDAGES/DRESSINGS) ×1 IMPLANT
GAUZE 4X4 16PLY ~~LOC~~+RFID DBL (SPONGE) ×1 IMPLANT
GLOVE BIOGEL PI IND STRL 7.0 (GLOVE) ×1 IMPLANT
GLOVE ECLIPSE 6.5 STRL STRAW (GLOVE) ×1 IMPLANT
GOWN STRL REUS W/TWL LRG LVL3 (GOWN DISPOSABLE) ×1 IMPLANT
IV NS IRRIG 3000ML ARTHROMATIC (IV SOLUTION) ×1 IMPLANT
KIT PROCEDURE FLUENT (KITS) ×1 IMPLANT
KIT TURNOVER CYSTO (KITS) ×1 IMPLANT
MYOSURE XL FIBROID (MISCELLANEOUS)
PACK VAGINAL MINOR WOMEN LF (CUSTOM PROCEDURE TRAY) ×1 IMPLANT
PAD OB MATERNITY 4.3X12.25 (PERSONAL CARE ITEMS) ×1 IMPLANT
PAD PREP 24X48 CUFFED NSTRL (MISCELLANEOUS) ×1 IMPLANT
SEAL CERVICAL OMNI LOK (ABLATOR) IMPLANT
SEAL ROD LENS SCOPE MYOSURE (ABLATOR) ×1 IMPLANT
SLEEVE SCD COMPRESS KNEE MED (STOCKING) ×1 IMPLANT
SYSTEM TISS REMOVAL MYOSURE XL (MISCELLANEOUS) IMPLANT
TOWEL OR 17X24 6PK STRL BLUE (TOWEL DISPOSABLE) ×1 IMPLANT
WATER STERILE IRR 500ML POUR (IV SOLUTION) ×1 IMPLANT

## 2022-05-04 NOTE — Brief Op Note (Signed)
05/04/2022  3:03 PM  PATIENT:  Leslie Richard  57 y.o. female  PRE-OPERATIVE DIAGNOSIS:  Post menopausal bleeding  POST-OPERATIVE DIAGNOSIS:  Post menopausal bleeding, endometrial polyp  PROCEDURE:  diagnostic hysteroscopy, hysteroscopic resection of endometrial polyp using myosure, dilation and curettage\  SURGEON:  Surgeon(s) and Role:    * Maxie Better, MD - Primary  PHYSICIAN ASSISTANT:   ASSISTANTS: none   ANESTHESIA:   general FINDINGS;  ANTERIOR RIGHT ENDOMETRIAL POLYP, ATROPHIC ENDOMETRIUM, TUBAL OSTIAS SEEN NO ENDOCERVICAL CANAL LESION EBL:  2 mL   BLOOD ADMINISTERED:none  DRAINS: none   LOCAL MEDICATIONS USED:  NONE  SPECIMEN:  Source of Specimen:  EMC with endometrial polyp  DISPOSITION OF SPECIMEN:  PATHOLOGY  COUNTS:  YES  TOURNIQUET:  * No tourniquets in log *  DICTATION: .Other Dictation: Dictation Number [id number]  PLAN OF CARE: Discharge to home after PACU  PATIENT DISPOSITION:  PACU - hemodynamically stable.   Delay start of Pharmacological VTE agent (>24hrs) due to surgical blood loss or risk of bleeding: no

## 2022-05-04 NOTE — Anesthesia Procedure Notes (Signed)
Procedure Name: LMA Insertion Date/Time: 05/04/2022 2:33 PM  Performed by: Jessica Priest, CRNAPre-anesthesia Checklist: Patient identified, Emergency Drugs available, Suction available, Patient being monitored and Timeout performed Patient Re-evaluated:Patient Re-evaluated prior to induction Oxygen Delivery Method: Circle system utilized Preoxygenation: Pre-oxygenation with 100% oxygen Induction Type: IV induction Ventilation: Mask ventilation without difficulty LMA: LMA inserted LMA Size: 3.0 Number of attempts: 1 Airway Equipment and Method: Bite block Placement Confirmation: positive ETCO2, breath sounds checked- equal and bilateral and CO2 detector Tube secured with: Tape Dental Injury: Teeth and Oropharynx as per pre-operative assessment

## 2022-05-04 NOTE — Anesthesia Preprocedure Evaluation (Signed)
Anesthesia Evaluation  Patient identified by MRN, date of birth, ID band Patient awake    Reviewed: Allergy & Precautions, H&P , NPO status , Patient's Chart, lab work & pertinent test results  History of Anesthesia Complications (+) PONV and history of anesthetic complications  Airway Mallampati: I  TM Distance: >3 FB Neck ROM: Full    Dental no notable dental hx.    Pulmonary neg pulmonary ROS   Pulmonary exam normal breath sounds clear to auscultation       Cardiovascular negative cardio ROS Normal cardiovascular exam Rhythm:Regular Rate:Normal     Neuro/Psych negative neurological ROS  negative psych ROS   GI/Hepatic negative GI ROS, Neg liver ROS,,,  Endo/Other  Hypothyroidism    Renal/GU negative Renal ROS  negative genitourinary   Musculoskeletal negative musculoskeletal ROS (+)    Abdominal   Peds negative pediatric ROS (+)  Hematology negative hematology ROS (+)   Anesthesia Other Findings   Reproductive/Obstetrics negative OB ROS                             Anesthesia Physical Anesthesia Plan  ASA: 2  Anesthesia Plan: General   Post-op Pain Management: Minimal or no pain anticipated and Toradol IV (intra-op)*   Induction: Intravenous  PONV Risk Score and Plan: 4 or greater and Ondansetron, Dexamethasone, Droperidol, Treatment may vary due to age or medical condition and Midazolam  Airway Management Planned: LMA  Additional Equipment:   Intra-op Plan:   Post-operative Plan: Extubation in OR  Informed Consent: I have reviewed the patients History and Physical, chart, labs and discussed the procedure including the risks, benefits and alternatives for the proposed anesthesia with the patient or authorized representative who has indicated his/her understanding and acceptance.     Dental advisory given  Plan Discussed with: CRNA and Surgeon  Anesthesia Plan  Comments:        Anesthesia Quick Evaluation

## 2022-05-04 NOTE — Op Note (Signed)
NAMEEARLIE, GETTYS MEDICAL RECORD NO: [id number] ACCOUNT NO: [id number] DATE OF BIRTH: 01/31/1965 FACILITY: [facility] LOCATION: [facility] PHYSICIAN: Yara Tomkinson A. Cherly Hensen, MD  Operative Report   DATE OF PROCEDURE: 05/04/2022  PREOPERATIVE DIAGNOSES:  Postmenopausal bleeding.  PROCEDURE:  Diagnostic hysteroscopy, hysteroscopic resection of endometrial polyp, dilation and curettage.  POSTOPERATIVE DIAGNOSES:  Postmenopausal bleeding, endometrial polyp.  ANESTHESIA:  General.  SURGEON:  Keyla Milone A. Cherly Hensen, MD.  ASSISTANT:  None.  DESCRIPTION OF PROCEDURE:  Under adequate general anesthesia, the patient was placed in the dorsal lithotomy position.  She was sterilely prepped and draped in the usual fashion.  Bimanual examination under anesthesia revealed an anteflexed uterus, no  adnexal masses could be appreciated.  Bivalve speculum was placed in the vagina.  A single-tooth tenaculum was placed on the anterior lip of the cervix.  Cervix was serially dilated up to #19 [facility] dilator.  Diagnostic hysteroscope was introduced into  the uterine cavity.  Both tubal ostias could be seen.  A right anterior polypoid lesion was noted.  Using the Reach resectoscope, the endometrial polyp as well as the endometrial wall was all resected. The endocervical canal was also inspected and no  lesions noted.  When all resection was noted to be complete, all instruments were then removed from the vagina.  SPECIMEN:  Labeled endometrial curettings with polyp was sent to pathology.  FLUID DEFICIT:  160 mL  ESTIMATED BLOOD LOSS:  2 mL.  COMPLICATIONS:  None.  The patient tolerated the procedure well, was transferred to recovery room in stable condition.   PUS D: 05/04/2022 3:13:23 pm T: 05/04/2022 5:17:00 pm  JOB: [id number]/ [id number]

## 2022-05-04 NOTE — Interval H&P Note (Signed)
History and Physical Interval Note:  05/04/2022 2:20 PM  Leslie Richard  has presented today for surgery, with the diagnosis of Post menopausal bleeding.  The various methods of treatment have been discussed with the patient and family. After consideration of risks, benefits and other options for treatment, the patient has consented to  Procedure: diagnostic hysteroscopy, hysteroscopic resection, dilation and curettage as a surgical intervention.  The patient's history has been reviewed, patient examined, no change in status, stable for surgery.  I have reviewed the patient's chart and labs.  Questions were answered to the patient's satisfaction.     Kristofor Michalowski A Addasyn Mcbreen

## 2022-05-04 NOTE — H&P (Signed)
Leslie Richard is an 57 y.o. female. WF presents for surgical evaluation of PMB. Pt is scheduled for diagnostic hysteroscopy, D&C. Pt had endometrial thickening for which ebx was done and showed atrophic endometrium. Pt has been on hormone replacement  Pertinent Gynecological History: Menses: post-menopausal Bleeding: post menopausal bleeding Contraception: none DES exposure: denies Blood transfusions: none Sexually transmitted diseases: no past history Previous GYN Procedures:  n/a   Last mammogram: normal Date: 2023 Last pap: normal Date: 2023    Menstrual History: Menarche age: n/a Patient's last menstrual period was 07/22/2010.    Past Medical History:  Diagnosis Date   Family history of adverse reaction to anesthesia    sister-- ponv   Hypothyroidism    followed by pcp   PMB (postmenopausal bleeding)    PONV (postoperative nausea and vomiting)     Past Surgical History:  Procedure Laterality Date   COLONOSCOPY  2022   SHOULDER ARTHROSCOPY WITH ROTATOR CUFF REPAIR Right 04/2011   revision   SHOULDER ARTHROSCOPY WITH ROTATOR CUFF REPAIR AND OPEN BICEPS TENODESIS Right 08/2010    Family History  Problem Relation Age of Onset   Breast cancer Mother    Breast cancer Maternal Grandmother     Social History:  reports that she has never smoked. She has never used smokeless tobacco. She reports current alcohol use of about 5.0 standard drinks of alcohol per week. She reports that she does not use drugs.  Allergies:  Allergies  Allergen Reactions   Ketoprofen Rash   Oxycodone Rash    No medications prior to admission.    Review of Systems  All other systems reviewed and are negative.   Last menstrual period 07/22/2010. Physical Exam Constitutional:      Appearance: Normal appearance.  HENT:     Head: Atraumatic.  Eyes:     Extraocular Movements: Extraocular movements intact.  Cardiovascular:     Rate and Rhythm: Regular rhythm.     Heart sounds:  Normal heart sounds.  Pulmonary:     Breath sounds: Normal breath sounds.  Abdominal:     Palpations: Abdomen is soft.  Genitourinary:    General: Normal vulva.     Comments: Vagina: nl Cervix closed Uterus Av Adnexa nl Musculoskeletal:        General: Normal range of motion.     Cervical back: Neck supple.  Skin:    General: Skin is warm and dry.  Neurological:     General: No focal deficit present.     Mental Status: She is alert and oriented to person, place, and time.  Psychiatric:        Mood and Affect: Mood normal.        Behavior: Behavior normal.     No results found for this or any previous visit (from the past 24 hour(s)).  No results found.  Assessment/Plan: PMB Endometrial thickening on sonogram P) dx hysteroscopy, D&C. Risk of surgery including infection, bleeding, injury to surrounding organ structures, fluid overload and its mgmt, uterine perforation ( 01/998) and its risk, thermal injury . All ? answered  Leslie Richard A Karlisha Mathena 05/04/2022, 6:01 AM

## 2022-05-04 NOTE — Anesthesia Postprocedure Evaluation (Signed)
Anesthesia Post Note  Patient: Leslie Richard  Procedure(s) Performed: DILATATION & CURETTAGE/HYSTEROSCOPY WITH MYOSURE     Patient location during evaluation: PACU Anesthesia Type: General Level of consciousness: awake and alert Pain management: pain level controlled Vital Signs Assessment: post-procedure vital signs reviewed and stable Respiratory status: spontaneous breathing, nonlabored ventilation, respiratory function stable and patient connected to nasal cannula oxygen Cardiovascular status: blood pressure returned to baseline and stable Postop Assessment: no apparent nausea or vomiting Anesthetic complications: no  No notable events documented.  Last Vitals:  Vitals:   05/04/22 1249 05/04/22 1500  BP: 111/72 118/66  Pulse: 66 74  Resp: 16   Temp: 36.9 C 36.6 C  SpO2: 95% 100%    Last Pain:  Vitals:   05/04/22 1500  TempSrc:   PainSc: 2                  Jodye Scali S

## 2022-05-04 NOTE — Discharge Instructions (Signed)
No acetaminophen/Tylenol until after 7:00pm today if needed for pain.    DISCHARGE INSTRUCTIONS: HYSTEROSCOPY  The following instructions have been prepared to help you care for yourself upon your return home.   Personal hygiene:  Use sanitary pads for vaginal drainage, not tampons.  Shower the day after your procedure.  NO tub baths, pools or Jacuzzis for 2-3 weeks.  Wipe front to back after using the bathroom.  Activity and limitations:  Do NOT drive or operate any equipment for 24 hours. The effects of anesthesia are still present and drowsiness may result.  Do NOT rest in bed all day.  Walking is encouraged.  Walk up and down stairs slowly.  You may resume your normal activity in one to two days or as indicated by your physician. Sexual activity: NO intercourse for at least 2 weeks after the procedure, or as indicated by your Doctor.  Diet: Eat a light meal as desired this evening. You may resume your usual diet tomorrow.  Return to Work: You may resume your work activities in one to two days or as indicated by Therapist, sports.  What to expect after your surgery: Expect to have vaginal bleeding/discharge for 2-3 days and spotting for up to 10 days. It is not unusual to have soreness for up to 1-2 weeks. You may have a slight burning sensation when you urinate for the first day. Mild cramps may continue for a couple of days. You may have a regular period in 2-6 weeks.  Call your doctor for any of the following:  Excessive vaginal bleeding or clotting, saturating and changing one pad every hour.  Inability to urinate 6 hours after discharge from hospital.  Pain not relieved by pain medication.  Fever of 100.4 F or greater.  Unusual vaginal discharge or odor.    Post Anesthesia Home Care Instructions  Activity: Get plenty of rest for the remainder of the day. A responsible individual must stay with you for 24 hours following the procedure.  For the next 24 hours, DO  NOT: -Drive a car -Advertising copywriter -Drink alcoholic beverages -Take any medication unless instructed by your physician -Make any legal decisions or sign important papers.  Meals: Start with liquid foods such as gelatin or soup. Progress to regular foods as tolerated. Avoid greasy, spicy, heavy foods. If nausea and/or vomiting occur, drink only clear liquids until the nausea and/or vomiting subsides. Call your physician if vomiting continues.  Special Instructions/Symptoms: Your throat may feel dry or sore from the anesthesia or the breathing tube placed in your throat during surgery. If this causes discomfort, gargle with warm salt water. The discomfort should disappear within 24 hours.  If you had a scopolamine patch placed behind your ear for the management of post- operative nausea and/or vomiting:  1. The medication in the patch is effective for 72 hours, after which it should be removed.  Wrap patch in a tissue and discard in the trash. Wash hands thoroughly with soap and water. 2. You may remove the patch earlier than 72 hours if you experience unpleasant side effects which may include dry mouth, dizziness or visual disturbances. 3. Avoid touching the patch. Wash your hands with soap and water after contact with the patch.

## 2022-05-04 NOTE — Transfer of Care (Signed)
Immediate Anesthesia Transfer of Care Note  Patient: Leslie Richard  Procedure(s) Performed: Procedure(s) (LRB): DILATATION & CURETTAGE/HYSTEROSCOPY WITH MYOSURE (N/A)  Patient Location: PACU  Anesthesia Type: General  Level of Consciousness: awake, sedated, patient cooperative and responds to stimulation  Airway & Oxygen Therapy: Patient Spontanous Breathing and Patient connected to [place] oxygen  Post-op Assessment: Report given to PACU RN, Post -op Vital signs reviewed and stable and Patient moving all extremities  Post vital signs: Reviewed and stable  Complications: No apparent anesthesia complications

## 2022-05-07 ENCOUNTER — Encounter (HOSPITAL_BASED_OUTPATIENT_CLINIC_OR_DEPARTMENT_OTHER): Payer: Self-pay | Admitting: Obstetrics and Gynecology

## 2022-05-07 LAB — SURGICAL PATHOLOGY

## 2022-05-18 ENCOUNTER — Other Ambulatory Visit: Payer: Self-pay | Admitting: Obstetrics and Gynecology

## 2022-06-28 ENCOUNTER — Ambulatory Visit: Payer: 59 | Admitting: Sports Medicine

## 2022-06-28 VITALS — BP 118/68 | Ht 62.5 in | Wt 114.0 lb

## 2022-06-28 DIAGNOSIS — M25521 Pain in right elbow: Secondary | ICD-10-CM | POA: Diagnosis not present

## 2022-06-28 DIAGNOSIS — G8929 Other chronic pain: Secondary | ICD-10-CM | POA: Diagnosis not present

## 2022-06-28 DIAGNOSIS — M25511 Pain in right shoulder: Secondary | ICD-10-CM

## 2022-06-28 NOTE — Progress Notes (Signed)
PCP: Patient, No Pcp Per  Subjective:   HPI: Patient is a 57 y.o. female with PMH right rotator cuff repair x 2 here for right elbow and shoulder pain.  Patient is no evidence clear.  She has not really changed her exercise routine monitor or had any new exertion or trauma, but started having right elbow pain.  Pain is present on both her lateral and medial elbow and typically happens with certain motions when she is playing tennis and persists afterwards for some time.  She is still able to continue playing tennis and do everything she wants to do, but states that her recovery time has gotten longer.  More recently, she is also started having pain around her lateral shoulder.  This also does not restrict her activities, but she wants to get it checked out because of her history of rotator cuff injuries and repairs.  She has started doing high-frequency ultrasound therapy which helps for the elbow.  She does not really take pain medicines.  Tried a elbow sleeve for some time, but did not help so she stopped.  No swelling or redness of either the elbow or the shoulder.       Objective:  BP 118/68   Ht 5' 2.5" (1.588 m)   Wt 114 lb (51.7 kg)   LMP 07/22/2010   BMI 20.52 kg/m   Physical Exam:  Gen: NAD, comfortable in exam room  Right elbow: No deformities, ecchymosis, edema or erythema.  No tenderness palpation over lateral or medial epicondyle.  Full range of motion.  5/5 strength flexion extension supination and pronation.  No reproducible pain with either flexion or extension, supination or pronation with or without resistance.  No reproducible pain with ECRB testing  Right shoulder:No deformities, ecchymosis, edema or erythema.  Full AROM and PROM.  5/5 strength abduction, internal and external rotation.  Mild reproducible pain with empty can testing.  No reproducible pain to internal or external rotation against resistance.  Negative crossarm test.  Negative liftoff test.   Assessment &  Plan:  1.  Right elbow pain: Presenting with chronic right elbow pain with exertion, certain motions during tennis.  Exam is nonspecific with no findings indicative of lateral or medial epicondylitis, biceps tendon pathology, brachial radialis pathology, or triceps pathology.  Overall likely indicative of general musculoskeletal strain. - Continue high-frequency ultrasound therapy for symptomatic relief. - Start PT. - Get tennis form evaluated - Follow-up in 3 to 4 weeks as needed.  Consider further diagnostic workup at that time if symptoms do not improve  2.  Right shoulder pain: Has history of 2 rotator cuff repairs.  Suspect flareup of rotator cuff tendinopathy as her shoulder is likely compensating for elbow given pain. - Start PT as above - Tennis form evaluation as above - Consider repeat imaging if pain worsens   Patient seen and evaluated with the resident.  I agree with the above plan of care.  [facility] at [facility]  She may wean to home exercise program per the therapist discretion and follow-up for ongoing or recalcitrant issues.  This note was dictated using Dragon naturally speaking software and may contain errors in syntax, spelling, or content which have not been identified prior to signing this note.

## 2022-06-28 NOTE — Patient Instructions (Signed)
[facility] [address] [place], [place] [address] [phone]

## 2022-09-05 IMAGING — MG MM DIGITAL DIAGNOSTIC UNILAT*R* W/ TOMO W/ CAD
4 series · 4 of 12 positions shown · non-contrast
Comparison: Previous exams including recent screening mammogram
dated 11/01/2020.

CLINICAL DATA: Patient returns today to evaluate a possible RIGHT
breast asymmetry questioned on recent screening mammogram.

EXAM:
DIGITAL DIAGNOSTIC UNILATERAL RIGHT MAMMOGRAM WITH TOMOSYNTHESIS AND
CAD
TECHNIQUE: Right digital diagnostic mammography and breast tomosynthesis was
performed. The images were evaluated with computer-aided detection.

[R MLO synth-2D]
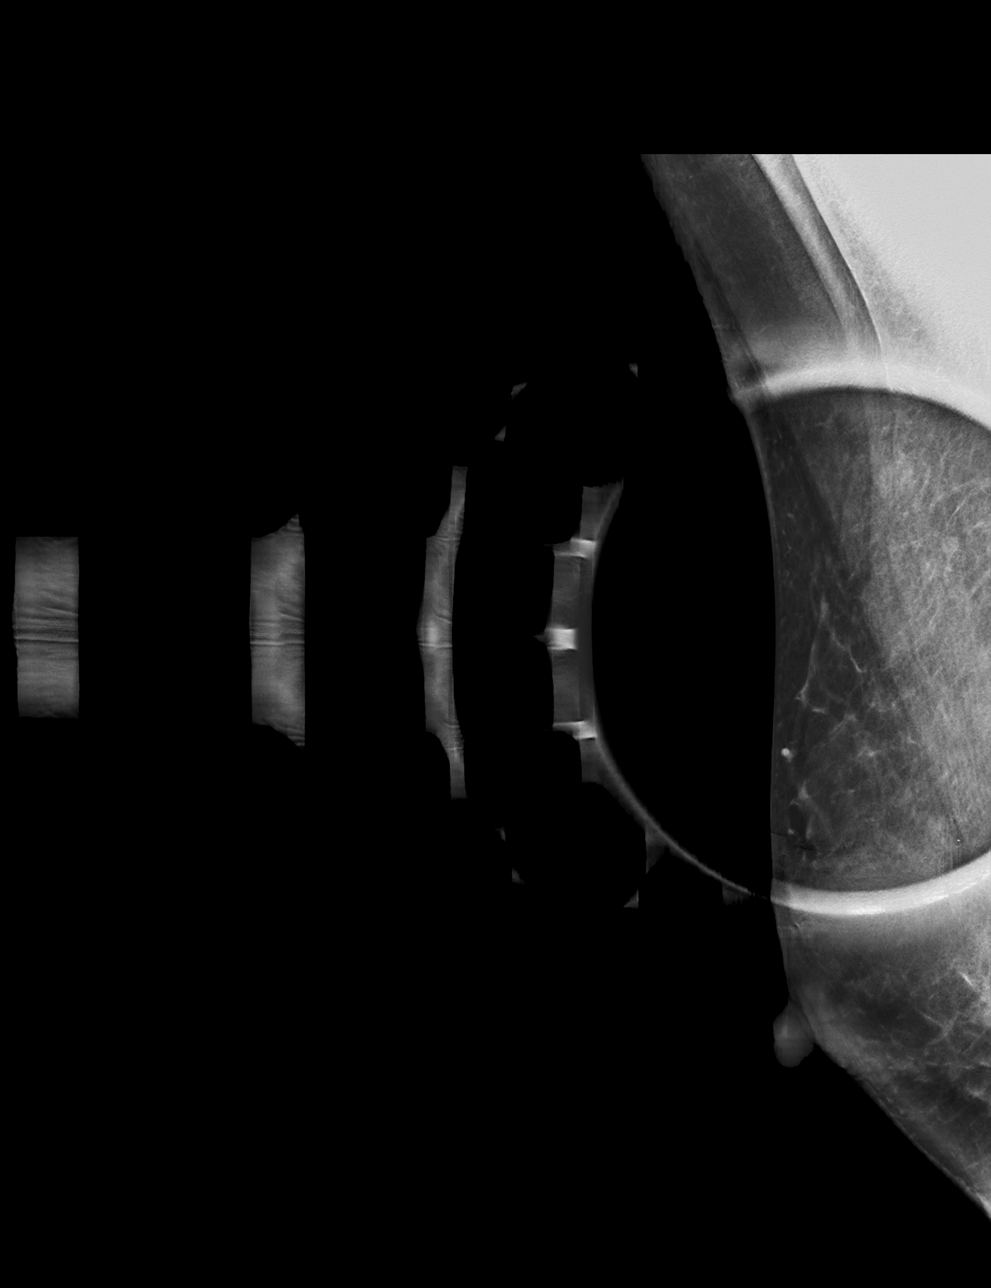

[R ML synth-2D]
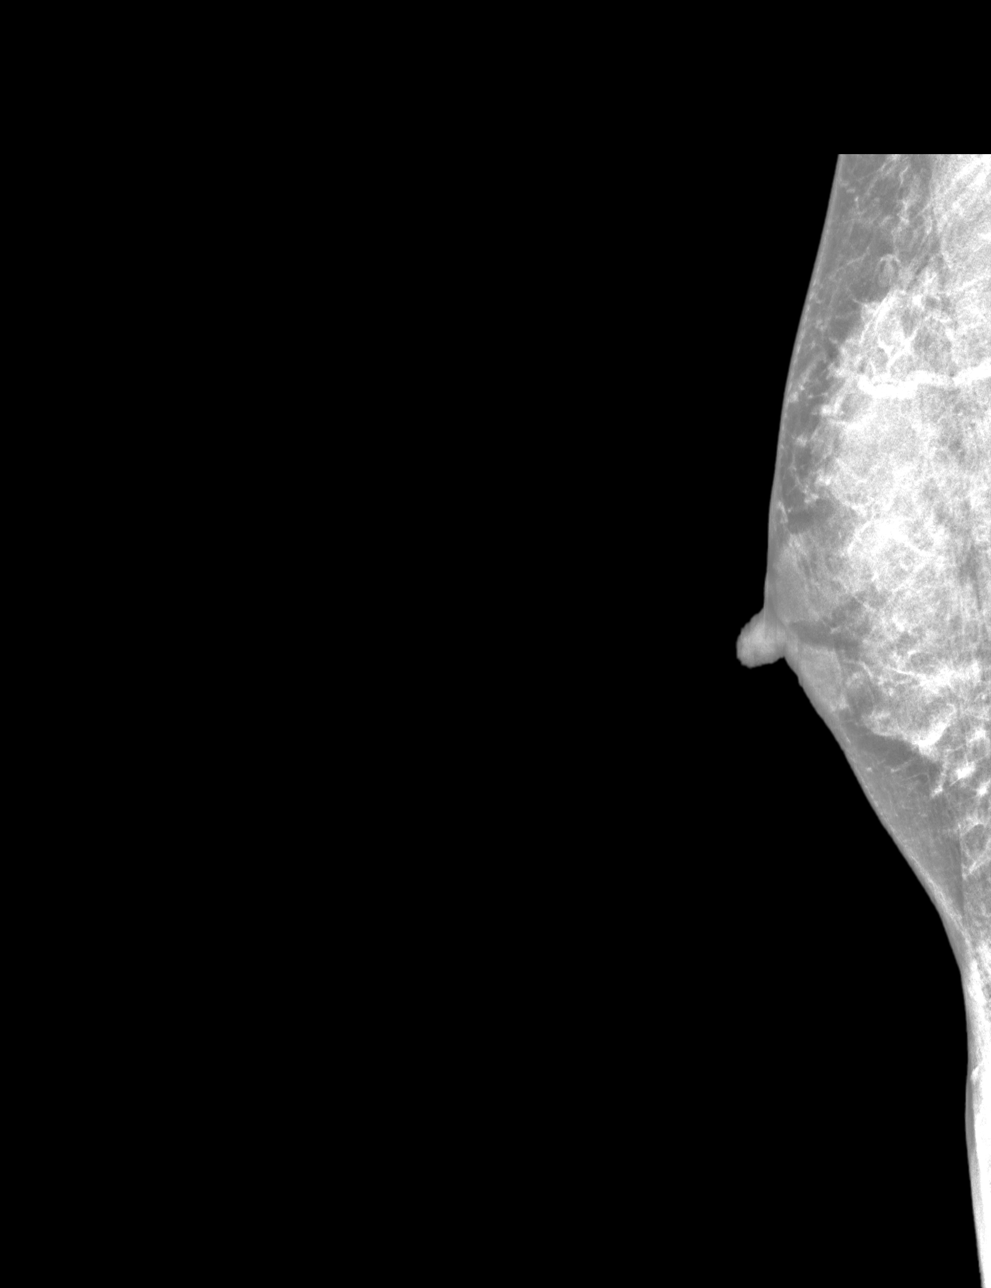

[R ML tomo · tomo slice 23/45.0]
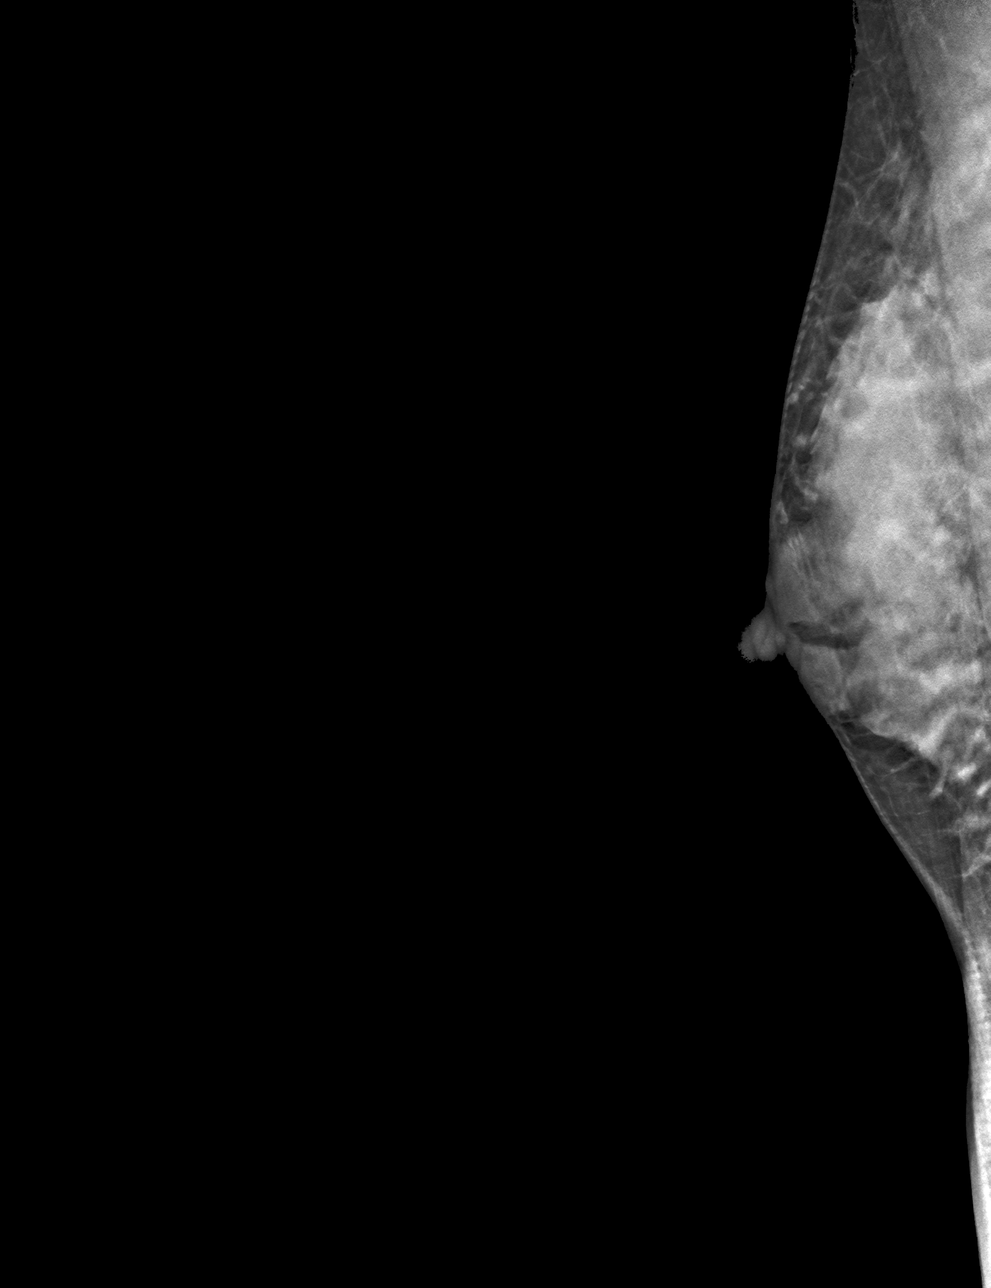

[R MLO tomo · tomo slice 29/57.0]
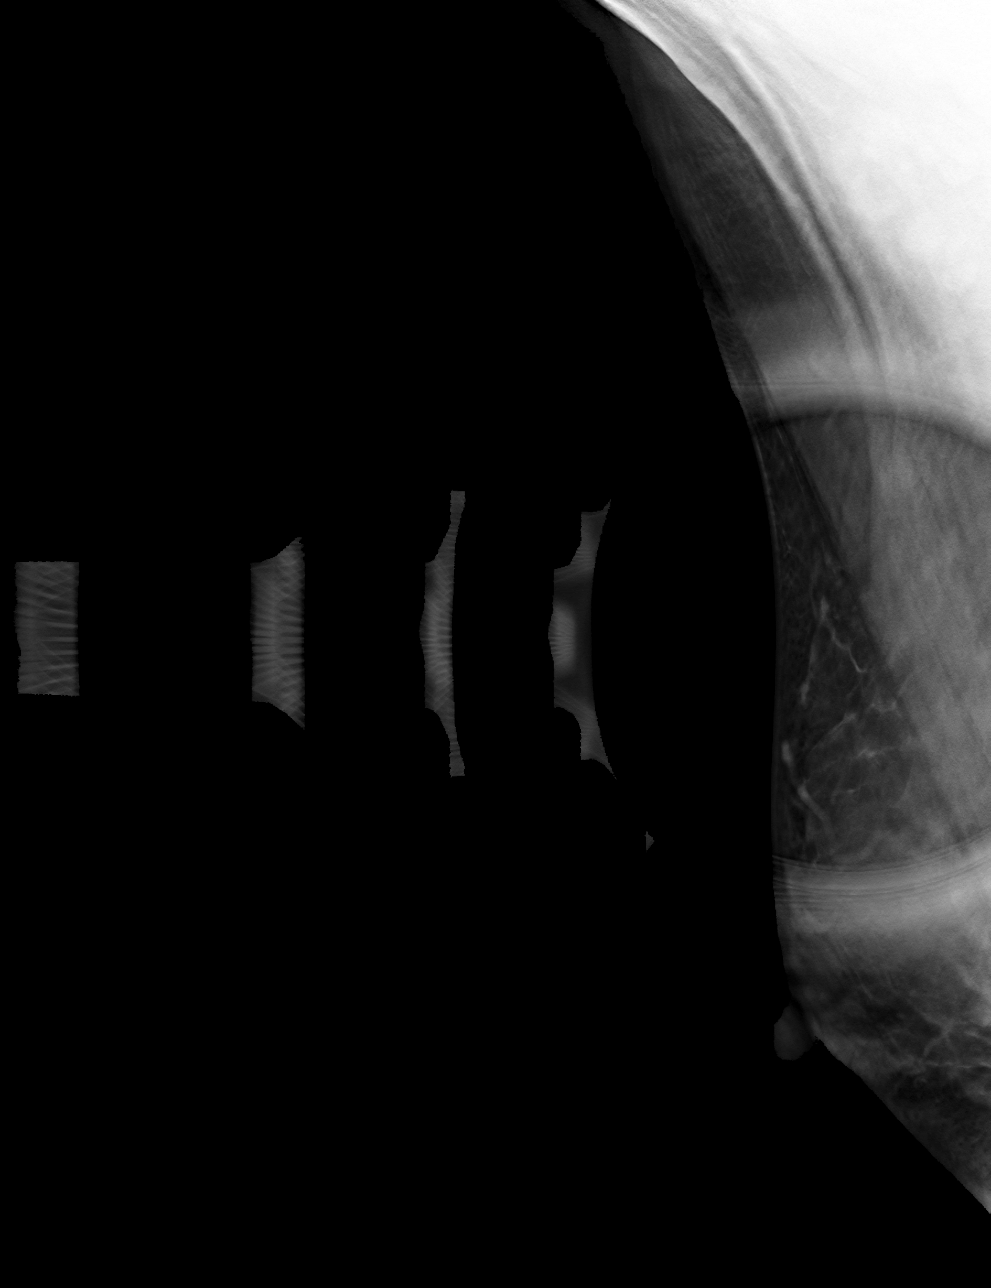

[4 of 12 positions shown; findings below may reference images not displayed]

ACR Breast Density Category d: The breast tissue is extremely dense,
which lowers the sensitivity of mammography.
FINDINGS: On today's additional diagnostic views, including spot compression
with 3D tomosynthesis, there is no persistent asymmetry within the
upper RIGHT breast indicating superimposition of normal dense
fibroglandular tissues.
IMPRESSION: No evidence of malignancy.

Patient may return to routine annual bilateral screening mammogram
schedule.

RECOMMENDATION:
Screening mammogram in one year.(Code:69-5-BDA)

I have discussed the findings and recommendations with the patient.
If applicable, a reminder letter will be sent to the patient
regarding the next appointment.

BI-RADS CATEGORY  1: Negative.

## 2023-02-19 ENCOUNTER — Other Ambulatory Visit: Payer: Self-pay | Admitting: Obstetrics and Gynecology

## 2023-02-19 DIAGNOSIS — Z1231 Encounter for screening mammogram for malignant neoplasm of breast: Secondary | ICD-10-CM

## 2023-02-21 ENCOUNTER — Ambulatory Visit
Admission: RE | Admit: 2023-02-21 | Discharge: 2023-02-21 | Disposition: A | Discharge status: Home / Self Care | Payer: 59 | Source: Ambulatory Visit

## 2023-02-21 DIAGNOSIS — Z1231 Encounter for screening mammogram for malignant neoplasm of breast: Secondary | ICD-10-CM
